# Patient Record
Sex: Female | Born: 1960
Health system: Southern US, Community
[De-identification: ages and names within clinical notes are randomized; demographics above are authoritative.]

## PROBLEM LIST (undated history)

## (undated) DIAGNOSIS — I2510 Atherosclerotic heart disease of native coronary artery without angina pectoris: Secondary | ICD-10-CM

## (undated) DIAGNOSIS — M069 Rheumatoid arthritis, unspecified: Secondary | ICD-10-CM

## (undated) DIAGNOSIS — I10 Essential (primary) hypertension: Secondary | ICD-10-CM

## (undated) DIAGNOSIS — I340 Nonrheumatic mitral (valve) insufficiency: Secondary | ICD-10-CM

## (undated) DIAGNOSIS — Z720 Tobacco use: Secondary | ICD-10-CM

## (undated) DIAGNOSIS — I272 Pulmonary hypertension, unspecified: Secondary | ICD-10-CM

## (undated) DIAGNOSIS — E785 Hyperlipidemia, unspecified: Secondary | ICD-10-CM

## (undated) HISTORY — DX: Nonrheumatic mitral (valve) insufficiency: I34.0

## (undated) HISTORY — DX: Pulmonary hypertension, unspecified: I27.20

## (undated) HISTORY — DX: Hyperlipidemia, unspecified: E78.5

## (undated) HISTORY — DX: Atherosclerotic heart disease of native coronary artery without angina pectoris: I25.10

---

## 1997-08-12 ENCOUNTER — Ambulatory Visit (HOSPITAL_COMMUNITY): Admission: RE | Admit: 1997-08-12 | Discharge: 1997-08-12 | Payer: Self-pay | Admitting: Obstetrics and Gynecology

## 2002-02-07 ENCOUNTER — Other Ambulatory Visit: Admission: RE | Admit: 2002-02-07 | Discharge: 2002-02-07 | Payer: Self-pay | Admitting: Obstetrics and Gynecology

## 2002-04-03 ENCOUNTER — Encounter: Payer: Self-pay | Admitting: Obstetrics and Gynecology

## 2002-04-11 ENCOUNTER — Observation Stay (HOSPITAL_COMMUNITY): Admission: RE | Admit: 2002-04-11 | Discharge: 2002-04-12 | Payer: Self-pay | Admitting: Obstetrics and Gynecology

## 2003-10-20 ENCOUNTER — Ambulatory Visit (HOSPITAL_COMMUNITY): Admission: RE | Admit: 2003-10-20 | Discharge: 2003-10-20 | Payer: Self-pay | Admitting: Orthopedic Surgery

## 2016-07-07 DIAGNOSIS — M0579 Rheumatoid arthritis with rheumatoid factor of multiple sites without organ or systems involvement: Secondary | ICD-10-CM | POA: Diagnosis not present

## 2016-08-17 DIAGNOSIS — M2550 Pain in unspecified joint: Secondary | ICD-10-CM | POA: Diagnosis not present

## 2016-08-17 DIAGNOSIS — Z79899 Other long term (current) drug therapy: Secondary | ICD-10-CM | POA: Diagnosis not present

## 2016-08-17 DIAGNOSIS — M0579 Rheumatoid arthritis with rheumatoid factor of multiple sites without organ or systems involvement: Secondary | ICD-10-CM | POA: Diagnosis not present

## 2016-09-01 DIAGNOSIS — M0579 Rheumatoid arthritis with rheumatoid factor of multiple sites without organ or systems involvement: Secondary | ICD-10-CM | POA: Diagnosis not present

## 2016-10-27 DIAGNOSIS — M0579 Rheumatoid arthritis with rheumatoid factor of multiple sites without organ or systems involvement: Secondary | ICD-10-CM | POA: Diagnosis not present

## 2016-10-27 DIAGNOSIS — Z79899 Other long term (current) drug therapy: Secondary | ICD-10-CM | POA: Diagnosis not present

## 2016-12-14 DIAGNOSIS — M0579 Rheumatoid arthritis with rheumatoid factor of multiple sites without organ or systems involvement: Secondary | ICD-10-CM | POA: Diagnosis not present

## 2016-12-14 DIAGNOSIS — Z79899 Other long term (current) drug therapy: Secondary | ICD-10-CM | POA: Diagnosis not present

## 2016-12-14 DIAGNOSIS — M2550 Pain in unspecified joint: Secondary | ICD-10-CM | POA: Diagnosis not present

## 2016-12-22 DIAGNOSIS — M0579 Rheumatoid arthritis with rheumatoid factor of multiple sites without organ or systems involvement: Secondary | ICD-10-CM | POA: Diagnosis not present

## 2017-01-20 DIAGNOSIS — K047 Periapical abscess without sinus: Secondary | ICD-10-CM | POA: Diagnosis not present

## 2017-01-20 DIAGNOSIS — R197 Diarrhea, unspecified: Secondary | ICD-10-CM | POA: Diagnosis not present

## 2017-01-26 DIAGNOSIS — R197 Diarrhea, unspecified: Secondary | ICD-10-CM | POA: Diagnosis not present

## 2017-02-01 DIAGNOSIS — R197 Diarrhea, unspecified: Secondary | ICD-10-CM | POA: Diagnosis not present

## 2017-02-09 DIAGNOSIS — K529 Noninfective gastroenteritis and colitis, unspecified: Secondary | ICD-10-CM | POA: Diagnosis not present

## 2017-02-09 DIAGNOSIS — K5289 Other specified noninfective gastroenteritis and colitis: Secondary | ICD-10-CM | POA: Diagnosis not present

## 2017-02-10 DIAGNOSIS — J329 Chronic sinusitis, unspecified: Secondary | ICD-10-CM | POA: Diagnosis not present

## 2017-02-10 DIAGNOSIS — I10 Essential (primary) hypertension: Secondary | ICD-10-CM | POA: Diagnosis not present

## 2017-02-17 DIAGNOSIS — I10 Essential (primary) hypertension: Secondary | ICD-10-CM | POA: Diagnosis not present

## 2017-03-16 DIAGNOSIS — M0579 Rheumatoid arthritis with rheumatoid factor of multiple sites without organ or systems involvement: Secondary | ICD-10-CM | POA: Diagnosis not present

## 2017-03-16 DIAGNOSIS — Z79899 Other long term (current) drug therapy: Secondary | ICD-10-CM | POA: Diagnosis not present

## 2017-03-16 DIAGNOSIS — M2550 Pain in unspecified joint: Secondary | ICD-10-CM | POA: Diagnosis not present

## 2017-04-03 DIAGNOSIS — Z720 Tobacco use: Secondary | ICD-10-CM | POA: Diagnosis not present

## 2017-04-03 DIAGNOSIS — I10 Essential (primary) hypertension: Secondary | ICD-10-CM | POA: Diagnosis not present

## 2017-07-24 DIAGNOSIS — Z0000 Encounter for general adult medical examination without abnormal findings: Secondary | ICD-10-CM | POA: Diagnosis not present

## 2017-08-03 DIAGNOSIS — Z79899 Other long term (current) drug therapy: Secondary | ICD-10-CM | POA: Diagnosis not present

## 2017-08-03 DIAGNOSIS — M2550 Pain in unspecified joint: Secondary | ICD-10-CM | POA: Diagnosis not present

## 2017-08-03 DIAGNOSIS — M0579 Rheumatoid arthritis with rheumatoid factor of multiple sites without organ or systems involvement: Secondary | ICD-10-CM | POA: Diagnosis not present

## 2018-02-01 DIAGNOSIS — M2550 Pain in unspecified joint: Secondary | ICD-10-CM | POA: Diagnosis not present

## 2018-02-01 DIAGNOSIS — M0579 Rheumatoid arthritis with rheumatoid factor of multiple sites without organ or systems involvement: Secondary | ICD-10-CM | POA: Diagnosis not present

## 2018-02-01 DIAGNOSIS — Z79899 Other long term (current) drug therapy: Secondary | ICD-10-CM | POA: Diagnosis not present

## 2018-08-06 DIAGNOSIS — M0579 Rheumatoid arthritis with rheumatoid factor of multiple sites without organ or systems involvement: Secondary | ICD-10-CM | POA: Diagnosis not present

## 2018-08-06 DIAGNOSIS — M2550 Pain in unspecified joint: Secondary | ICD-10-CM | POA: Diagnosis not present

## 2018-08-06 DIAGNOSIS — Z79899 Other long term (current) drug therapy: Secondary | ICD-10-CM | POA: Diagnosis not present

## 2019-12-05 ENCOUNTER — Ambulatory Visit: Payer: Self-pay

## 2020-10-12 ENCOUNTER — Other Ambulatory Visit: Payer: Self-pay

## 2020-10-12 ENCOUNTER — Inpatient Hospital Stay (HOSPITAL_COMMUNITY)
Admission: EM | Admit: 2020-10-12 | Discharge: 2020-10-20 | DRG: 234 | Disposition: A | Discharge status: Home / Self Care | Payer: 59 | Attending: Thoracic Surgery (Cardiothoracic Vascular Surgery) | Admitting: Thoracic Surgery (Cardiothoracic Vascular Surgery)

## 2020-10-12 ENCOUNTER — Emergency Department (HOSPITAL_COMMUNITY): Payer: 59

## 2020-10-12 ENCOUNTER — Encounter (HOSPITAL_COMMUNITY): Payer: Self-pay | Admitting: Physician Assistant

## 2020-10-12 DIAGNOSIS — F17210 Nicotine dependence, cigarettes, uncomplicated: Secondary | ICD-10-CM | POA: Diagnosis not present

## 2020-10-12 DIAGNOSIS — I340 Nonrheumatic mitral (valve) insufficiency: Secondary | ICD-10-CM | POA: Diagnosis present

## 2020-10-12 DIAGNOSIS — Z419 Encounter for procedure for purposes other than remedying health state, unspecified: Secondary | ICD-10-CM

## 2020-10-12 DIAGNOSIS — Z720 Tobacco use: Secondary | ICD-10-CM

## 2020-10-12 DIAGNOSIS — Z20822 Contact with and (suspected) exposure to covid-19: Secondary | ICD-10-CM | POA: Diagnosis not present

## 2020-10-12 DIAGNOSIS — Z8249 Family history of ischemic heart disease and other diseases of the circulatory system: Secondary | ICD-10-CM

## 2020-10-12 DIAGNOSIS — I214 Non-ST elevation (NSTEMI) myocardial infarction: Principal | ICD-10-CM | POA: Diagnosis present

## 2020-10-12 DIAGNOSIS — M069 Rheumatoid arthritis, unspecified: Secondary | ICD-10-CM | POA: Diagnosis present

## 2020-10-12 DIAGNOSIS — I160 Hypertensive urgency: Secondary | ICD-10-CM | POA: Diagnosis present

## 2020-10-12 DIAGNOSIS — J9811 Atelectasis: Secondary | ICD-10-CM

## 2020-10-12 DIAGNOSIS — Z9889 Other specified postprocedural states: Secondary | ICD-10-CM

## 2020-10-12 DIAGNOSIS — I25110 Atherosclerotic heart disease of native coronary artery with unstable angina pectoris: Secondary | ICD-10-CM | POA: Diagnosis present

## 2020-10-12 DIAGNOSIS — Z823 Family history of stroke: Secondary | ICD-10-CM

## 2020-10-12 DIAGNOSIS — D62 Acute posthemorrhagic anemia: Secondary | ICD-10-CM | POA: Diagnosis not present

## 2020-10-12 DIAGNOSIS — I2510 Atherosclerotic heart disease of native coronary artery without angina pectoris: Secondary | ICD-10-CM | POA: Diagnosis not present

## 2020-10-12 DIAGNOSIS — Z951 Presence of aortocoronary bypass graft: Secondary | ICD-10-CM

## 2020-10-12 DIAGNOSIS — Z803 Family history of malignant neoplasm of breast: Secondary | ICD-10-CM

## 2020-10-12 DIAGNOSIS — R7303 Prediabetes: Secondary | ICD-10-CM | POA: Diagnosis not present

## 2020-10-12 DIAGNOSIS — Z01810 Encounter for preprocedural cardiovascular examination: Secondary | ICD-10-CM | POA: Diagnosis not present

## 2020-10-12 DIAGNOSIS — D696 Thrombocytopenia, unspecified: Secondary | ICD-10-CM | POA: Diagnosis present

## 2020-10-12 DIAGNOSIS — I10 Essential (primary) hypertension: Secondary | ICD-10-CM | POA: Diagnosis present

## 2020-10-12 DIAGNOSIS — E785 Hyperlipidemia, unspecified: Secondary | ICD-10-CM | POA: Diagnosis not present

## 2020-10-12 HISTORY — DX: Rheumatoid arthritis, unspecified: M06.9

## 2020-10-12 HISTORY — DX: Tobacco use: Z72.0

## 2020-10-12 HISTORY — DX: Essential (primary) hypertension: I10

## 2020-10-12 LAB — CBC
HCT: 45.6 % (ref 36.0–46.0)
Hemoglobin: 14.5 g/dL (ref 12.0–15.0)
MCH: 24.5 pg — ABNORMAL LOW (ref 26.0–34.0)
MCHC: 31.8 g/dL (ref 30.0–36.0)
MCV: 76.9 fL — ABNORMAL LOW (ref 80.0–100.0)
Platelets: 120 10*3/uL — ABNORMAL LOW (ref 150–400)
RBC: 5.93 MIL/uL — ABNORMAL HIGH (ref 3.87–5.11)
RDW: 14.9 % (ref 11.5–15.5)
WBC: 6.2 10*3/uL (ref 4.0–10.5)
nRBC: 0 % (ref 0.0–0.2)

## 2020-10-12 LAB — HEMOGLOBIN A1C
Hgb A1c MFr Bld: 5.9 % — ABNORMAL HIGH (ref 4.8–5.6)
Mean Plasma Glucose: 122.63 mg/dL

## 2020-10-12 LAB — BASIC METABOLIC PANEL
Anion gap: 8 (ref 5–15)
BUN: 9 mg/dL (ref 6–20)
CO2: 24 mmol/L (ref 22–32)
Calcium: 9.4 mg/dL (ref 8.9–10.3)
Chloride: 105 mmol/L (ref 98–111)
Creatinine, Ser: 0.87 mg/dL (ref 0.44–1.00)
GFR, Estimated: >60 mL/min (ref >60)
Glucose, Bld: 110 mg/dL — ABNORMAL HIGH (ref 70–99)
Potassium: 3.9 mmol/L (ref 3.5–5.1)
Sodium: 137 mmol/L (ref 135–145)

## 2020-10-12 LAB — RESP PANEL BY RT-PCR (FLU A&B, COVID) ARPGX2
Influenza A by PCR: NEGATIVE
Influenza B by PCR: NEGATIVE
SARS Coronavirus 2 by RT PCR: NEGATIVE

## 2020-10-12 LAB — TROPONIN I (HIGH SENSITIVITY)
Troponin I (High Sensitivity): 1577 ng/L (ref <18)
Troponin I (High Sensitivity): 3345 ng/L (ref <18)
Troponin I (High Sensitivity): 7755 ng/L (ref <18)
Troponin I (High Sensitivity): 9893 ng/L (ref <18)

## 2020-10-12 LAB — HIV ANTIBODY (ROUTINE TESTING W REFLEX): HIV Screen 4th Generation wRfx: NONREACTIVE

## 2020-10-12 MED ORDER — ONDANSETRON HCL 4 MG/2ML IJ SOLN: 4.0000 mg | Freq: Four times a day (QID) | INTRAMUSCULAR | Status: DC | PRN

## 2020-10-12 MED ORDER — ISOSORBIDE MONONITRATE ER 30 MG PO TB24
30.0000 mg | ORAL_TABLET | Freq: Every day | ORAL | Status: DC
  Administered 2020-10-12 – 2020-10-15 (×4): 30 mg via ORAL
  Filled 2020-10-12 (×4): qty 1

## 2020-10-12 MED ORDER — SODIUM CHLORIDE 0.9 % IV SOLN: 250.0000 mL | INTRAVENOUS | Status: DC | PRN

## 2020-10-12 MED ORDER — CARVEDILOL 6.25 MG PO TABS
6.2500 mg | ORAL_TABLET | Freq: Two times a day (BID) | ORAL | Status: DC
  Administered 2020-10-12 – 2020-10-13 (×2): 6.25 mg via ORAL
  Filled 2020-10-12 (×2): qty 1

## 2020-10-12 MED ORDER — HEPARIN (PORCINE) 25000 UT/250ML-% IV SOLN
1050.0000 [IU]/h | INTRAVENOUS | Status: DC
  Administered 2020-10-12: 800 [IU]/h via INTRAVENOUS
  Filled 2020-10-12: qty 250

## 2020-10-12 MED ORDER — SODIUM CHLORIDE 0.9 % WEIGHT BASED INFUSION: 1.0000 mL/kg/h | INTRAVENOUS | Status: DC

## 2020-10-12 MED ORDER — ASPIRIN 81 MG PO CHEW
81.0000 mg | CHEWABLE_TABLET | ORAL | Status: AC
  Administered 2020-10-13: 81 mg via ORAL
  Filled 2020-10-12: qty 1

## 2020-10-12 MED ORDER — HEPARIN BOLUS VIA INFUSION
4000.0000 [IU] | Freq: Once | INTRAVENOUS | Status: AC
  Administered 2020-10-12: 4000 [IU] via INTRAVENOUS
  Filled 2020-10-12: qty 4000

## 2020-10-12 MED ORDER — HYDRALAZINE HCL 20 MG/ML IJ SOLN
10.0000 mg | Freq: Once | INTRAMUSCULAR | Status: AC
  Administered 2020-10-12: 10 mg via INTRAVENOUS
  Filled 2020-10-12: qty 1

## 2020-10-12 MED ORDER — SODIUM CHLORIDE 0.9 % WEIGHT BASED INFUSION
3.0000 mL/kg/h | INTRAVENOUS | Status: AC
  Administered 2020-10-13: 3 mL/kg/h via INTRAVENOUS

## 2020-10-12 MED ORDER — NITROGLYCERIN 0.4 MG SL SUBL: 0.4000 mg | SUBLINGUAL_TABLET | SUBLINGUAL | Status: DC | PRN

## 2020-10-12 MED ORDER — ACETAMINOPHEN 325 MG PO TABS: 650.0000 mg | ORAL_TABLET | ORAL | Status: DC | PRN

## 2020-10-12 MED ORDER — SODIUM CHLORIDE 0.9% FLUSH: 3.0000 mL | INTRAVENOUS | Status: DC | PRN

## 2020-10-12 MED ORDER — SODIUM CHLORIDE 0.9% FLUSH
3.0000 mL | Freq: Two times a day (BID) | INTRAVENOUS | Status: DC
  Administered 2020-10-13 (×2): 3 mL via INTRAVENOUS

## 2020-10-12 MED ORDER — ATORVASTATIN CALCIUM 80 MG PO TABS
80.0000 mg | ORAL_TABLET | Freq: Every day | ORAL | Status: DC
  Administered 2020-10-12 – 2020-10-20 (×8): 80 mg via ORAL
  Filled 2020-10-12 (×7): qty 1
  Filled 2020-10-12: qty 2

## 2020-10-12 MED ORDER — ASPIRIN EC 81 MG PO TBEC
81.0000 mg | DELAYED_RELEASE_TABLET | Freq: Every day | ORAL | Status: DC
  Administered 2020-10-13: 81 mg via ORAL
  Filled 2020-10-12: qty 1

## 2020-10-12 NOTE — H&P (Addendum)
Cardiology Admission History and Physical:   Patient ID: April Bryant MRN: [id number]; DOB: 1961/03/16   Admission date: 10/12/2020  PCP:  Farris Has, MD   [facility]  Cardiologist: New  Chief Complaint: Throat tightness  Patient Profile:   April Bryant is a 60 y.o. female with history of hypertension, rheumatoid arthritis and tobacco smoking presented for evaluation of throat tightness and found to have elevated troponin consistent with non-STEMI.  History of Present Illness:   Ms. Briski had a sudden onset of throat closing around noon today.  It progressed and felt like she cannot breathe with pressure sensation across chest.  She took inhaler with minimal improved symptoms.  EMS was called.  By the time EMS arrived, her symptoms were resolved.  Total duration of symptoms about 20 minutes.  She did not wanted to go to ER.  However significant other concern after EMS left and brought to [facility] for further evaluation.  Patient had similar but worse episode few weeks ago while walking uphill.  At that time her symptoms lasted for approximately 90 minutes.  Since that episode patient may had a few episodes of throat tightness but only lasting for few seconds.  Patient reports on and off less than half a pack a day tobacco smoking for past 30 years.  Report quits last month.  No illicit drug use.  Denies orthopnea, PND, dizziness, palpitation, abdominal pain, melena or blood in the stool or urine.  High-sensitivity troponin 1577>>3345 Creatinine and electrolyte are normal Hemoglobin 14.5 Pending respiratory panel  Currently asymptomatic.   Past Medical History:  Diagnosis Date   Hypertension    Rheumatoid arthritis (HCC)    Tobacco abuse      Medications Prior to Admission: Prior to Admission medications   Not on File    Reports taking lisinopril  Allergies:   Not on File  Denies allergies to any medications  Social  History:   Social History   Socioeconomic History   Marital status: Single    Spouse name: Not on file   Number of children: Not on file   Years of education: Not on file   Highest education level: Not on file  Occupational History   Not on file  Tobacco Use   Smoking status: Not on file   Smokeless tobacco: Not on file  Substance and Sexual Activity   Alcohol use: Not on file   Drug use: Not on file   Sexual activity: Not on file  Other Topics Concern   Not on file  Social History Narrative   Not on file   Social Determinants of Health   Financial Resource Strain: Not on file  Food Insecurity: Not on file  Transportation Needs: Not on file  Physical Activity: Not on file  Stress: Not on file  Social Connections: Not on file  Intimate Partner Violence: Not on file    Family History:  The patient's family history includes Breast cancer in her mother; Heart failure in her mother.    ROS:  Please see the history of present illness.  All other ROS reviewed and negative.     Physical Exam/Data:   Vitals:   10/12/20 1815 10/12/20 1830 10/12/20 1845 10/12/20 1900  BP: (!) 172/98 (!) 187/90 (!) 184/86 (!) 170/115  Pulse: 67 76 80 (!) 59  Resp: 15 17 15 15   Temp:      TempSrc:      SpO2: 99% 100% 100% 100%  Weight:      Height:       No intake or output data in the 24 hours ending 10/12/20 1920 Last 3 Weights 10/12/2020  Weight (lbs) 147 lb  Weight (kg) 66.679 kg     Body mass index is 26.04 kg/m.  General:  Well nourished, well developed, in no acute distress HEENT: normal Lymph: no adenopathy Neck: no JVD Endocrine:  No thryomegaly Vascular: No carotid bruits; FA pulses 2+ bilaterally without bruits  Cardiac:  normal S1, S2; RRR; no murmur  Lungs:  clear to auscultation bilaterally, no wheezing, rhonchi or rales  Abd: soft, nontender, no hepatomegaly  Ext: not edema Musculoskeletal:  No deformities, BUE and BLE strength normal and equal Skin: warm and  dry  Neuro:  CNs 2-12 intact, no focal abnormalities noted Psych:  Normal affect    EKG:  The ECG that was done today was personally reviewed and demonstrates sinus rhythm with minimal ST depression in lateral leads  Relevant CV Studies: As above  Laboratory Data:  High Sensitivity Troponin:   Recent Labs  Lab 10/12/20 1631 10/12/20 1737  TROPONINIHS 1,577* 3,345*      Chemistry Recent Labs  Lab 10/12/20 1631  NA 137  K 3.9  CL 105  CO2 24  GLUCOSE 110*  BUN 9  CREATININE 0.87  CALCIUM 9.4  GFRNONAA >60  ANIONGAP 8    No results for input(s): PROT, ALBUMIN, AST, ALT, ALKPHOS, BILITOT in the last 168 hours. Hematology Recent Labs  Lab 10/12/20 1631  WBC 6.2  RBC 5.93*  HGB 14.5  HCT 45.6  MCV 76.9*  MCH 24.5*  MCHC 31.8  RDW 14.9  PLT 120*   BNPNo results for input(s): BNP, PROBNP in the last 168 hours.  DDimer No results for input(s): DDIMER in the last 168 hours.   Radiology/Studies:  DG Chest 2 View  Result Date: 10/12/2020 CLINICAL DATA:  Chest pain EXAM: CHEST - 2 VIEW COMPARISON:  March 19, 2009 FINDINGS: The lungs are clear. The heart size and pulmonary vascularity are normal. No adenopathy. There is lower thoracic dextroscoliosis. IMPRESSION: Lungs clear.  Heart size normal. Electronically Signed   By: Bretta Bang III M.D.   On: 10/12/2020 15:17     Assessment and Plan:   Non-STEMI -Patient presented at throat tightness/closing that radiated across chest and felt like chest pressure.  Her symptoms lasted for 15-20 minutes today.  However had a worse episode few weeks ago which lasted for about 90 minutes. -High-sensitivity troponin 1577>> 3345 -She already got aspirin 324 mg when EMS came to her home -Continue IV heparin -Start statin -Start Coreg -Check lipid panel and hemoglobin A1c -Admit and cycle troponin -Get echocardiogram The patient understands that risks include but are not limited to stroke (1 in 1000), death (1 in  1000), kidney failure [usually temporary] (1 in 500), bleeding (1 in 200), allergic reaction [possibly serious] (1 in 200), and agrees to proceed.    2.  Hypertensive urgency -Blood pressure elevated -Reports taking lisinopril at home (Pnding pharmacy review of medications) -Start carvedilol -Give 1 dose of IV hydralazine   Risk Assessment/Risk Scores:   TIMI Risk Score for Unstable Angina or Non-ST Elevation MI:   The patient's TIMI risk score is 3, which indicates a 13% risk of all cause mortality, new or recurrent myocardial infarction or need for urgent revascularization in the next 14 days.{   Severity of Illness: The appropriate patient status for this patient is INPATIENT. Inpatient  status is judged to be reasonable and necessary in order to provide the required intensity of service to ensure the patient's safety. The patient's presenting symptoms, physical exam findings, and initial radiographic and laboratory data in the context of their chronic comorbidities is felt to place them at high risk for further clinical deterioration. Furthermore, it is not anticipated that the patient will be medically stable for discharge from the hospital within 2 midnights of admission. The following factors support the patient status of inpatient.   " The patient's presenting symptoms include throat tightness. " The worrisome physical exam findings include none " The initial radiographic and laboratory data are worrisome because of *elevated enzymes " The chronic co-morbidities include hypertension and tobacco smoking   * I certify that at the point of admission it is my clinical judgment that the patient will require inpatient hospital care spanning beyond 2 midnights from the point of admission due to high intensity of service, high risk for further deterioration and high frequency of surveillance required.*    For questions or updates, please contact [facility] Please consult [email or web address]  for contact info under     Lorelei Pont, PA  10/12/2020 7:20 PM   Personally seen and examined. Agree with APP above with the following comments: Briefly 60 yo F with a history of HTN, Tobacco Abuse, RA, and FHX of CAD (younger brother) Patient notes two episodes of exercise induced throat discomfort; this one has improved with her inhaler.  No CP in ED and patient was considering leaving AMA Exam notable for +3 radial and femoral; BP pre-consent for LHC SBP 169/110; post consent 197/120 Labs notable for Novel troponin 1.5 K -> 3.3 K Personally reviewed relevant tests; no NSVT on telemetry Would recommend  - starting Coreg, and IMDUR, getting Echo - LHC for tomorrow - continue on BP control - patient is amenable to stay and to stop smoking - discussed with patient and wife; who know that depending on clinical scenario, we may not wait until wife's return to proceed to [facility].  Riley Lam, MD Cardiologist [facility]  [address], [address] [place], [place] [address] [phone]  7:49 PM

## 2020-10-12 NOTE — Progress Notes (Signed)
ANTICOAGULATION CONSULT NOTE - Initial Consult  Pharmacy Consult for heparin Indication: chest pain/ACS  Not on File  Patient Measurements: Height: 5\' 3"  (160 cm) Weight: 66.7 kg (147 lb) IBW/kg (Calculated) : 52.4 Heparin Dosing Weight: 65.9 kg  Vital Signs: Temp: 98.4 F (36.9 C) (04/11 1602) Temp Source: Oral (04/11 1602) BP: 162/85 (04/11 1715) Pulse Rate: 57 (04/11 1715)  Labs: Recent Labs    10/12/20 1631  HGB 14.5  HCT 45.6  PLT 120*  CREATININE 0.87  TROPONINIHS 1,577*    Estimated Creatinine Clearance: 63.1 mL/min (by C-G formula based on SCr of 0.87 mg/dL).   Medical History: No past medical history on file.  Medications:  Scheduled:  . heparin  4,000 Units Intravenous Once    Assessment: 60 yof presenting with felt like throat was closing (couldn't take deep breath, had chest pain) - no AC PTA.  Hgb 14.5, plt 120. Trop 1577. No s/sx of bleeding.   Goal of Therapy:  Heparin level 0.3-0.7 units/ml Monitor platelets by anticoagulation protocol: Yes   Plan:  Give 4000 units bolus x 1 Start heparin infusion at 800 units/hr Check anti-Xa level in 6 hours and daily while on heparin Continue to monitor H&H and platelets  12/12/20, PharmD, BCCCP Clinical Pharmacist  Phone: [phone] 10/12/2020 6:30 PM  Please check AMION for all [facility] phone numbers After 10:00 PM, call Main Pharmacy [phone]

## 2020-10-12 NOTE — ED Provider Notes (Signed)
[facility] EMERGENCY DEPARTMENT Provider Note   CSN: [id number] Arrival date & time: 10/12/20  1427     History Chief Complaint  Patient presents with  . Chest Pain    April Bryant is a 60 y.o. female.  60 year old female presents after feeling like her throat was closing up today.  States that she is had similar episodes with cold weather.  Feels that this might be pollen related.  Felt like she could not take a deep breath and her chest became tight.  Also began to hyperventilate had bilateral hand tingling.  Was outside when this occurred and was already diaphoretic.  Patient used her albuterol inhaler which did give her some relief.  EMS was called and she was found to be profoundly hypertensive.  She was transported here for further management.  She currently feels asymptomatic        No past medical history on file.  There are no problems to display for this patient.      OB History   No obstetric history on file.     No family history on file.     Home Medications Prior to Admission medications   Not on File    Allergies    Patient has no allergy information on record.  Review of Systems   Review of Systems  All other systems reviewed and are negative.   Physical Exam Updated Vital Signs BP (!) 208/104   Pulse 82   Temp (!) 97.5 F (36.4 C) (Oral)   SpO2 100%   Physical Exam Vitals and nursing note reviewed.  Constitutional:      General: She is not in acute distress.    Appearance: Normal appearance. She is well-developed. She is not toxic-appearing.  HENT:     Head: Normocephalic and atraumatic.  Eyes:     General: Lids are normal.     Conjunctiva/sclera: Conjunctivae normal.     Pupils: Pupils are equal, round, and reactive to light.  Neck:     Thyroid: No thyroid mass.     Trachea: No tracheal deviation.  Cardiovascular:     Rate and Rhythm: Normal rate and regular rhythm.     Heart sounds: Normal heart  sounds. No murmur heard. No gallop.   Pulmonary:     Effort: Pulmonary effort is normal. No respiratory distress.     Breath sounds: Normal breath sounds. No stridor. No decreased breath sounds, wheezing, rhonchi or rales.  Abdominal:     General: Bowel sounds are normal. There is no distension.     Palpations: Abdomen is soft.     Tenderness: There is no abdominal tenderness. There is no rebound.  Musculoskeletal:        General: No tenderness. Normal range of motion.     Cervical back: Normal range of motion and neck supple.  Skin:    General: Skin is warm and dry.     Findings: No abrasion or rash.  Neurological:     Mental Status: She is alert and oriented to person, place, and time.     GCS: GCS eye subscore is 4. GCS verbal subscore is 5. GCS motor subscore is 6.     Cranial Nerves: No cranial nerve deficit.     Sensory: No sensory deficit.  Psychiatric:        Speech: Speech normal.        Behavior: Behavior normal.     ED Results / Procedures / Treatments  Labs (all labs ordered are listed, but only abnormal results are displayed) Labs Reviewed  BASIC METABOLIC PANEL  CBC  TROPONIN I (HIGH SENSITIVITY)    EKG EKG Interpretation  Date/Time:  Monday October 12 2020 14:39:07 EDT Ventricular Rate:  68 PR Interval:  154 QRS Duration: 86 QT Interval:  440 QTC Calculation: 467 R Axis:   59 Text Interpretation: Normal sinus rhythm Possible Left atrial enlargement ST-t wave abnormality Abnormal ECG Confirmed by Gerhard Munch [phone]) on 10/12/2020 2:45:35 PM   Radiology DG Chest 2 View  Result Date: 10/12/2020 CLINICAL DATA:  Chest pain EXAM: CHEST - 2 VIEW COMPARISON:  March 19, 2009 FINDINGS: The lungs are clear. The heart size and pulmonary vascularity are normal. No adenopathy. There is lower thoracic dextroscoliosis. IMPRESSION: Lungs clear.  Heart size normal. Electronically Signed   By: Bretta Bang III M.D.   On: 10/12/2020 15:17     Procedures Procedures   Medications Ordered in ED Medications - No data to display  ED Course  I have reviewed the triage vital signs and the nursing notes.  Pertinent labs & imaging results that were available during my care of the patient were reviewed by me and considered in my medical decision making (see chart for details).    MDM Rules/Calculators/A&P                         Patient's EKG does show some new inferolateral ST depression.  Troponin came back elevated at almost 1600.  She is currently pain-free at this time.  Have ordered heparin per pharmacy.  Discussed with Dr. Gaynelle Arabian from cardiology and he will come admit the patient  CRITICAL CARE Performed by: Toy Baker Total critical care time: 50 minutes Critical care time was exclusive of separately billable procedures and treating other patients. Critical care was necessary to treat or prevent imminent or life-threatening deterioration. Critical care was time spent personally by me on the following activities: development of treatment plan with patient and/or surrogate as well as nursing, discussions with consultants, evaluation of patient's response to treatment, examination of patient, obtaining history from patient or surrogate, ordering and performing treatments and interventions, ordering and review of laboratory studies, ordering and review of radiographic studies, pulse oximetry and re-evaluation of patient's condition.  Final Clinical Impression(s) / ED Diagnoses Final diagnoses:  None    Rx / DC Orders ED Discharge Orders    None       Lorre Nick, MD 10/12/20 1806

## 2020-10-12 NOTE — ED Notes (Signed)
This RN went to start Heparin and explained the purpose of the medication and pt voiced that she did not want to be admitted and that she wanted to leave. Cardiologist is now at bedside.

## 2020-10-12 NOTE — ED Notes (Signed)
Attempted to give reportx1 

## 2020-10-12 NOTE — ED Triage Notes (Signed)
Patient stated her throat felt like closing up earlier today followed by shortness of breath and chest pain. Endorsed taking 4 baby ASA PTA.

## 2020-10-13 ENCOUNTER — Inpatient Hospital Stay (HOSPITAL_COMMUNITY): Payer: 59

## 2020-10-13 ENCOUNTER — Telehealth: Payer: Self-pay | Admitting: Internal Medicine

## 2020-10-13 ENCOUNTER — Encounter (HOSPITAL_COMMUNITY)
Admission: EM | Disposition: A | Discharge status: Home / Self Care | Payer: Self-pay | Source: Home / Self Care | Attending: Thoracic Surgery (Cardiothoracic Vascular Surgery)

## 2020-10-13 ENCOUNTER — Encounter (HOSPITAL_COMMUNITY): Payer: Self-pay | Admitting: Internal Medicine

## 2020-10-13 DIAGNOSIS — I10 Essential (primary) hypertension: Secondary | ICD-10-CM | POA: Diagnosis present

## 2020-10-13 DIAGNOSIS — I214 Non-ST elevation (NSTEMI) myocardial infarction: Secondary | ICD-10-CM

## 2020-10-13 DIAGNOSIS — R7303 Prediabetes: Secondary | ICD-10-CM | POA: Diagnosis present

## 2020-10-13 DIAGNOSIS — I340 Nonrheumatic mitral (valve) insufficiency: Secondary | ICD-10-CM

## 2020-10-13 DIAGNOSIS — I2510 Atherosclerotic heart disease of native coronary artery without angina pectoris: Secondary | ICD-10-CM

## 2020-10-13 DIAGNOSIS — I25110 Atherosclerotic heart disease of native coronary artery with unstable angina pectoris: Secondary | ICD-10-CM

## 2020-10-13 DIAGNOSIS — E785 Hyperlipidemia, unspecified: Secondary | ICD-10-CM | POA: Diagnosis present

## 2020-10-13 DIAGNOSIS — I160 Hypertensive urgency: Secondary | ICD-10-CM | POA: Diagnosis not present

## 2020-10-13 DIAGNOSIS — Z720 Tobacco use: Secondary | ICD-10-CM | POA: Diagnosis not present

## 2020-10-13 HISTORY — PX: INTRAVASCULAR PRESSURE WIRE/FFR STUDY: CATH118243

## 2020-10-13 HISTORY — PX: LEFT HEART CATH AND CORONARY ANGIOGRAPHY: CATH118249

## 2020-10-13 LAB — ECHOCARDIOGRAM COMPLETE
AR max vel: 1.58 cm2
AV Area VTI: 1.42 cm2
AV Area mean vel: 1.58 cm2
AV Mean grad: 5 mmHg
AV Peak grad: 8.9 mmHg
Ao pk vel: 1.49 m/s
Area-P 1/2: 3.5 cm2
Calc EF: 36.5 %
Height: 63 in
MV M vel: 5.58 m/s
MV Peak grad: 124.3 mmHg
P 1/2 time: 730 msec
Radius: 0.35 cm
S' Lateral: 2.8 cm
Single Plane A2C EF: 40.3 %
Single Plane A4C EF: 35.7 %
Weight: 2304 oz

## 2020-10-13 LAB — BASIC METABOLIC PANEL
Anion gap: 9 (ref 5–15)
BUN: 8 mg/dL (ref 6–20)
CO2: 22 mmol/L (ref 22–32)
Calcium: 9 mg/dL (ref 8.9–10.3)
Chloride: 108 mmol/L (ref 98–111)
Creatinine, Ser: 0.66 mg/dL (ref 0.44–1.00)
GFR, Estimated: >60 mL/min (ref >60)
Glucose, Bld: 108 mg/dL — ABNORMAL HIGH (ref 70–99)
Potassium: 3.5 mmol/L (ref 3.5–5.1)
Sodium: 139 mmol/L (ref 135–145)

## 2020-10-13 LAB — URINALYSIS, COMPLETE (UACMP) WITH MICROSCOPIC
Bacteria, UA: NONE SEEN
Bilirubin Urine: NEGATIVE
Glucose, UA: NEGATIVE mg/dL
Hgb urine dipstick: NEGATIVE
Ketones, ur: NEGATIVE mg/dL
Leukocytes,Ua: NEGATIVE
Nitrite: NEGATIVE
Protein, ur: NEGATIVE mg/dL
Specific Gravity, Urine: 1.014 (ref 1.005–1.030)
pH: 6 (ref 5.0–8.0)

## 2020-10-13 LAB — CBC
HCT: 40.7 % (ref 36.0–46.0)
Hemoglobin: 13 g/dL (ref 12.0–15.0)
MCH: 24.3 pg — ABNORMAL LOW (ref 26.0–34.0)
MCHC: 31.9 g/dL (ref 30.0–36.0)
MCV: 76.1 fL — ABNORMAL LOW (ref 80.0–100.0)
Platelets: 130 10*3/uL — ABNORMAL LOW (ref 150–400)
RBC: 5.35 MIL/uL — ABNORMAL HIGH (ref 3.87–5.11)
RDW: 14.9 % (ref 11.5–15.5)
WBC: 5.8 10*3/uL (ref 4.0–10.5)
nRBC: 0 % (ref 0.0–0.2)

## 2020-10-13 LAB — LIPID PANEL
Cholesterol: 216 mg/dL — ABNORMAL HIGH (ref 0–200)
HDL: 66 mg/dL (ref >40)
LDL Cholesterol: 134 mg/dL — ABNORMAL HIGH (ref 0–99)
Total CHOL/HDL Ratio: 3.3 RATIO
Triglycerides: 82 mg/dL (ref <150)
VLDL: 16 mg/dL (ref 0–40)

## 2020-10-13 LAB — HEPARIN LEVEL (UNFRACTIONATED): Heparin Unfractionated: 0.15 IU/mL — ABNORMAL LOW (ref 0.30–0.70)

## 2020-10-13 LAB — POCT ACTIVATED CLOTTING TIME: Activated Clotting Time: 386 seconds

## 2020-10-13 SURGERY — LEFT HEART CATH AND CORONARY ANGIOGRAPHY
Anesthesia: LOCAL

## 2020-10-13 MED ORDER — SODIUM CHLORIDE 0.9 % IV SOLN
250.0000 mL | INTRAVENOUS | Status: DC | PRN
  Administered 2020-10-17: 250 mL via INTRAVENOUS

## 2020-10-13 MED ORDER — FENTANYL CITRATE (PF) 100 MCG/2ML IJ SOLN
INTRAMUSCULAR | Status: DC | PRN
  Administered 2020-10-13: 50 ug via INTRAVENOUS

## 2020-10-13 MED ORDER — SODIUM CHLORIDE 0.9 % IV SOLN: INTRAVENOUS | Status: AC

## 2020-10-13 MED ORDER — SODIUM CHLORIDE 0.9% FLUSH: 3.0000 mL | INTRAVENOUS | Status: DC | PRN

## 2020-10-13 MED ORDER — ONDANSETRON HCL 4 MG/2ML IJ SOLN: 4.0000 mg | Freq: Four times a day (QID) | INTRAMUSCULAR | Status: DC | PRN

## 2020-10-13 MED ORDER — ADENOSINE (DIAGNOSTIC) 140MCG/KG/MIN
INTRAVENOUS | Status: DC | PRN
  Administered 2020-10-13: 140 ug/kg/min via INTRAVENOUS

## 2020-10-13 MED ORDER — IOHEXOL 350 MG/ML SOLN
INTRAVENOUS | Status: DC | PRN
  Administered 2020-10-13: 150 mL

## 2020-10-13 MED ORDER — HEPARIN BOLUS VIA INFUSION
2000.0000 [IU] | Freq: Once | INTRAVENOUS | Status: AC
  Administered 2020-10-13: 2000 [IU] via INTRAVENOUS
  Filled 2020-10-13: qty 2000

## 2020-10-13 MED ORDER — LIDOCAINE HCL (PF) 1 % IJ SOLN
INTRAMUSCULAR | Status: DC | PRN
  Administered 2020-10-13: 2 mL

## 2020-10-13 MED ORDER — CARVEDILOL 12.5 MG PO TABS
12.5000 mg | ORAL_TABLET | Freq: Two times a day (BID) | ORAL | Status: DC
  Administered 2020-10-13 – 2020-10-14 (×2): 12.5 mg via ORAL
  Filled 2020-10-13 (×2): qty 1

## 2020-10-13 MED ORDER — HEPARIN SODIUM (PORCINE) 1000 UNIT/ML IJ SOLN
INTRAMUSCULAR | Status: AC
  Filled 2020-10-13: qty 1

## 2020-10-13 MED ORDER — HEPARIN (PORCINE) IN NACL 1000-0.9 UT/500ML-% IV SOLN
INTRAVENOUS | Status: AC
  Filled 2020-10-13: qty 500

## 2020-10-13 MED ORDER — FENTANYL CITRATE (PF) 100 MCG/2ML IJ SOLN
INTRAMUSCULAR | Status: AC
  Filled 2020-10-13: qty 2

## 2020-10-13 MED ORDER — HYDRALAZINE HCL 20 MG/ML IJ SOLN: 10.0000 mg | INTRAMUSCULAR | Status: AC | PRN

## 2020-10-13 MED ORDER — VERAPAMIL HCL 2.5 MG/ML IV SOLN
INTRAVENOUS | Status: DC | PRN
  Administered 2020-10-13: 10 mL via INTRA_ARTERIAL

## 2020-10-13 MED ORDER — LIDOCAINE HCL (PF) 1 % IJ SOLN
INTRAMUSCULAR | Status: AC
  Filled 2020-10-13: qty 30

## 2020-10-13 MED ORDER — LABETALOL HCL 5 MG/ML IV SOLN: 10.0000 mg | INTRAVENOUS | Status: AC | PRN

## 2020-10-13 MED ORDER — ACETAMINOPHEN 325 MG PO TABS
650.0000 mg | ORAL_TABLET | ORAL | Status: DC | PRN
  Administered 2020-10-15: 650 mg via ORAL
  Filled 2020-10-13: qty 2

## 2020-10-13 MED ORDER — HEPARIN SODIUM (PORCINE) 1000 UNIT/ML IJ SOLN
INTRAMUSCULAR | Status: DC | PRN
  Administered 2020-10-13 (×2): 4000 [IU] via INTRAVENOUS

## 2020-10-13 MED ORDER — MIDAZOLAM HCL 2 MG/2ML IJ SOLN
INTRAMUSCULAR | Status: DC | PRN
  Administered 2020-10-13 (×2): 1 mg via INTRAVENOUS

## 2020-10-13 MED ORDER — NITROGLYCERIN IN D5W 200-5 MCG/ML-% IV SOLN
10.0000 ug/min | INTRAVENOUS | Status: DC
  Administered 2020-10-13: 10 ug/min via INTRAVENOUS
  Filled 2020-10-13: qty 250

## 2020-10-13 MED ORDER — HEPARIN (PORCINE) 25000 UT/250ML-% IV SOLN
1250.0000 [IU]/h | INTRAVENOUS | Status: AC
  Administered 2020-10-13: 1050 [IU]/h via INTRAVENOUS
  Administered 2020-10-14 – 2020-10-15 (×2): 1250 [IU]/h via INTRAVENOUS
  Filled 2020-10-13 (×3): qty 250

## 2020-10-13 MED ORDER — ASPIRIN 81 MG PO CHEW
81.0000 mg | CHEWABLE_TABLET | Freq: Every day | ORAL | Status: DC
  Administered 2020-10-14 – 2020-10-15 (×2): 81 mg via ORAL
  Filled 2020-10-13 (×2): qty 1

## 2020-10-13 MED ORDER — MIDAZOLAM HCL 2 MG/2ML IJ SOLN
INTRAMUSCULAR | Status: AC
  Filled 2020-10-13: qty 2

## 2020-10-13 MED ORDER — SODIUM CHLORIDE 0.9% FLUSH
3.0000 mL | Freq: Two times a day (BID) | INTRAVENOUS | Status: DC
  Administered 2020-10-13 – 2020-10-18 (×6): 3 mL via INTRAVENOUS

## 2020-10-13 MED ORDER — VERAPAMIL HCL 2.5 MG/ML IV SOLN
INTRAVENOUS | Status: AC
  Filled 2020-10-13: qty 2

## 2020-10-13 MED ORDER — HEPARIN (PORCINE) IN NACL 1000-0.9 UT/500ML-% IV SOLN
INTRAVENOUS | Status: DC | PRN
  Administered 2020-10-13 (×2): 500 mL

## 2020-10-13 MED ORDER — LISINOPRIL 5 MG PO TABS
2.5000 mg | ORAL_TABLET | Freq: Every day | ORAL | Status: DC
  Administered 2020-10-13: 2.5 mg via ORAL
  Filled 2020-10-13: qty 1

## 2020-10-13 MED ORDER — ADENOSINE 12 MG/4ML IV SOLN
INTRAVENOUS | Status: AC
  Filled 2020-10-13: qty 16

## 2020-10-13 SURGICAL SUPPLY — 14 items
CATH 5FR JL3.5 JR4 ANG PIG MP (CATHETERS) ×2 IMPLANT
CATH VISTA GUIDE 6FR XBLAD3.5 (CATHETERS) ×2 IMPLANT
DEVICE RAD COMP TR BAND LRG (VASCULAR PRODUCTS) ×2 IMPLANT
GLIDESHEATH SLEND A-KIT 6F 22G (SHEATH) ×2 IMPLANT
GUIDEWIRE INQWIRE 1.5J.035X260 (WIRE) ×1 IMPLANT
GUIDEWIRE PRESSURE X 175 (WIRE) ×2 IMPLANT
INQWIRE 1.5J .035X260CM (WIRE) ×2
KIT ENCORE 26 ADVANTAGE (KITS) ×2 IMPLANT
KIT ESSENTIALS PG (KITS) ×2 IMPLANT
KIT HEART LEFT (KITS) ×2 IMPLANT
PACK CARDIAC CATHETERIZATION (CUSTOM PROCEDURE TRAY) ×2 IMPLANT
SHEATH PROBE COVER 6X72 (BAG) ×2 IMPLANT
TRANSDUCER W/STOPCOCK (MISCELLANEOUS) ×2 IMPLANT
TUBING CIL FLEX 10 FLL-RA (TUBING) ×2 IMPLANT

## 2020-10-13 NOTE — Progress Notes (Signed)
Progress Note  Patient Name: April Bryant Date of Encounter: 10/13/2020  [facility] Cardiologist: Christell Constant, MD   Subjective   No events overnight.  Found to have mutli-vessel disease.  CT Surg aware and will eval patient.  Patient and wife are nervous with many questions about surgery.  Inpatient Medications    Scheduled Meds: . aspirin EC  81 mg Oral Daily  . atorvastatin  80 mg Oral Daily  . carvedilol  12.5 mg Oral BID WC  . isosorbide mononitrate  30 mg Oral Daily  . lisinopril  2.5 mg Oral Daily  . sodium chloride flush  3 mL Intravenous Q12H   Continuous Infusions: . sodium chloride    . sodium chloride 1 mL/kg/hr (10/13/20 0500)  . heparin     PRN Meds: sodium chloride, acetaminophen, nitroGLYCERIN, ondansetron (ZOFRAN) IV, sodium chloride flush   Vital Signs    Vitals:   10/13/20 1243 10/13/20 1300 10/13/20 1313 10/13/20 1328  BP: (!) 168/99 (!) 164/84 (!) 165/91 (!) 165/88  Pulse: 85 75 72 63  Resp:      Temp:      TempSrc:      SpO2: 100% 97% 100% 100%  Weight:      Height:       No intake or output data in the 24 hours ending 10/13/20 1406 Last 3 Weights 10/13/2020 10/12/2020 10/12/2020  Weight (lbs) 144 lb 144 lb 1.6 oz 147 lb  Weight (kg) 65.318 kg 65.363 kg 66.679 kg      Telemetry    SR to Sinus Tach - Personally Reviewed  ECG    Sinus rhythm, rate 82 bpm, LVH with early repol, submm STD in inferolateral leads; no significant change from previous - Personally Reviewed  Physical Exam   GEN: No acute distress.   Neck: No JVD Cardiac: RRR, no murmurs, rubs, or gallops.  No R radial hematoma Respiratory: Clear to auscultation bilaterally. GI: Soft, nontender, non-distended  MS: No edema; No deformity. Neuro:  Nonfocal  Psych: Normal affect   Labs    High Sensitivity Troponin:   Recent Labs  Lab 10/12/20 1631 10/12/20 1737 10/12/20 1928 10/12/20 2212  TROPONINIHS 1,577* 3,345* 7,755* 9,893*       Chemistry Recent Labs  Lab 10/12/20 1631 10/13/20 0145  NA 137 139  K 3.9 3.5  CL 105 108  CO2 24 22  GLUCOSE 110* 108*  BUN 9 8  CREATININE 0.87 0.66  CALCIUM 9.4 9.0  GFRNONAA >60 >60  ANIONGAP 8 9     Hematology Recent Labs  Lab 10/12/20 1631 10/13/20 0145  WBC 6.2 5.8  RBC 5.93* 5.35*  HGB 14.5 13.0  HCT 45.6 40.7  MCV 76.9* 76.1*  MCH 24.5* 24.3*  MCHC 31.8 31.9  RDW 14.9 14.9  PLT 120* 130*    BNPNo results for input(s): BNP, PROBNP in the last 168 hours.   DDimer No results for input(s): DDIMER in the last 168 hours.   Radiology    DG Chest 2 View  Result Date: 10/12/2020 CLINICAL DATA:  Chest pain EXAM: CHEST - 2 VIEW COMPARISON:  March 19, 2009 FINDINGS: The lungs are clear. The heart size and pulmonary vascularity are normal. No adenopathy. There is lower thoracic dextroscoliosis. IMPRESSION: Lungs clear.  Heart size normal. Electronically Signed   By: Bretta Bang III M.D.   On: 10/12/2020 15:17   CARDIAC CATHETERIZATION  Result Date: 10/13/2020  Culprit lesion is a large branching ramus intermedius and  has ostial to proximal narrowing without landing zone for PCI.  Left main is widely patent  LAD proximal to mid segment with intermediate stenosis in the 65 to 75% range documented to be hemodynamically significant by both RFR (0.82) and FFR (0.72).  Circumflex is relatively small.  There is a moderate-sized third obtuse marginal.  The mid vessel contains eccentric 50 to 60% narrowing.  RCA is dominant.  There is significant tortuosity.  The distal RCA before the PDA contains segmental 50% narrowing.  The ostial to proximal PDA is 60-70% narrowed.  Overall LV function is normal with EF 60%. RECOMMENDATIONS:  TCTS consultation.  There is three-vessel coronary artery disease in this patient who smokes, has significant hypertension, is prediabetic, and hyperlipidemia.  PCI on ramus intermedius would require overhang of stent into the distal  left main.  ECHOCARDIOGRAM COMPLETE  Result Date: 10/13/2020    ECHOCARDIOGRAM REPORT   Patient Name:   April Bryant Date of Exam: 10/13/2020 Medical Rec #:  [id number]        Height:       63.0 in Accession #:    [id number]       Weight:       144.0 lb Date of Birth:  [birth year]        BSA:          1.682 m Patient Age:    60 years         BP:           182/94 mmHg Patient Gender: F                HR:           60 bpm. Exam Location:  Inpatient Procedure: 2D Echo, Cardiac Doppler and Color Doppler Indications:    NSTEMI  History:        Patient has no prior history of Echocardiogram examinations.                 Risk Factors:Hypertension.  Sonographer:    Neomia Dear RDCS Referring Phys: [id number] Manson Passey  Sonographer Comments: 10/13/20 cath IMPRESSIONS  1. Left ventricular ejection fraction, by estimation, is 55 to 60%. The left ventricle has normal function. The left ventricle has no regional wall motion abnormalities. Left ventricular diastolic parameters are consistent with Grade II diastolic dysfunction (pseudonormalization).  2. Right ventricular systolic function is normal. The right ventricular size is normal. There is moderately elevated pulmonary artery systolic pressure.  3. Left atrial size was mild to moderately dilated.  4. The mitral valve is normal in structure. Moderate mitral valve regurgitation. No evidence of mitral stenosis.  5. The aortic valve is normal in structure. Aortic valve regurgitation is not visualized. No aortic stenosis is present. FINDINGS  Left Ventricle: Left ventricular ejection fraction, by estimation, is 55 to 60%. The left ventricle has normal function. The left ventricle has no regional wall motion abnormalities. The left ventricular internal cavity size was normal in size. There is  no left ventricular hypertrophy. Left ventricular diastolic parameters are consistent with Grade II diastolic dysfunction (pseudonormalization). Right Ventricle: The right  ventricular size is normal. No increase in right ventricular wall thickness. Right ventricular systolic function is normal. There is moderately elevated pulmonary artery systolic pressure. The tricuspid regurgitant velocity is 3.76 m/s, and with an assumed right atrial pressure of 3 mmHg, the estimated right ventricular systolic pressure is 59.6 mmHg. Left Atrium: Left atrial size was mild to moderately dilated. Right Atrium: Right  atrial size was normal in size. Pericardium: There is no evidence of pericardial effusion. Mitral Valve: The mitral valve is normal in structure. Moderate mitral valve regurgitation. No evidence of mitral valve stenosis. Tricuspid Valve: The tricuspid valve is normal in structure. Tricuspid valve regurgitation is not demonstrated. No evidence of tricuspid stenosis. Aortic Valve: The aortic valve is normal in structure. Aortic valve regurgitation is not visualized. Aortic regurgitation PHT measures 730 msec. No aortic stenosis is present. Aortic valve mean gradient measures 5.0 mmHg. Aortic valve peak gradient measures 8.9 mmHg. Aortic valve area, by VTI measures 1.42 cm. Pulmonic Valve: The pulmonic valve was normal in structure. Pulmonic valve regurgitation is not visualized. Aorta: The aortic root and ascending aorta are structurally normal, with no evidence of dilitation. IAS/Shunts: The atrial septum is grossly normal.  LEFT VENTRICLE PLAX 2D LVIDd:         4.60 cm     Diastology LVIDs:         2.80 cm     LV e' medial:    5.87 cm/s LV PW:         1.10 cm     LV E/e' medial:  15.8 LV IVS:        1.40 cm     LV e' lateral:   4.57 cm/s LVOT diam:     2.00 cm     LV E/e' lateral: 20.3 LV SV:         47 LV SV Index:   28 LVOT Area:     3.14 cm  LV Volumes (MOD) LV vol d, MOD A2C: 68.9 ml LV vol d, MOD A4C: 83.3 ml LV vol s, MOD A2C: 41.1 ml LV vol s, MOD A4C: 53.5 ml LV SV MOD A2C:     27.8 ml LV SV MOD A4C:     83.3 ml LV SV MOD BP:      27.4 ml  PULMONARY VEINS A Reversal Duration:  108.00 msec A Reversal Velocity: 26.20 cm/s Diastolic Velocity:  49.40 cm/s S/D Velocity:        1.20 Systolic Velocity:   60.80 cm/s LEFT ATRIUM             Index       RIGHT ATRIUM          Index LA diam:        4.00 cm 2.38 cm/m  RA Area:     8.92 cm LA Vol (A2C):   49.1 ml 29.20 ml/m RA Volume:   18.50 ml 11.00 ml/m LA Vol (A4C):   92.7 ml 55.12 ml/m LA Biplane Vol: 68.1 ml 40.50 ml/m  AORTIC VALVE                    PULMONIC VALVE AV Area (Vmax):    1.58 cm     PV Vmax:       0.95 m/s AV Area (Vmean):   1.58 cm     PV Vmean:      72.700 cm/s AV Area (VTI):     1.42 cm     PV VTI:        0.222 m AV Vmax:           149.00 cm/s  PV Peak grad:  3.6 mmHg AV Vmean:          103.400 cm/s PV Mean grad:  2.0 mmHg AV VTI:            0.332 m AV Peak Grad:  8.9 mmHg AV Mean Grad:      5.0 mmHg LVOT Vmax:         74.75 cm/s LVOT Vmean:        52.100 cm/s LVOT VTI:          0.150 m LVOT/AV VTI ratio: 0.45 AI PHT:            730 msec  AORTA Ao Root diam: 3.30 cm Ao Asc diam:  2.90 cm MITRAL VALVE                 TRICUSPID VALVE MV Area (PHT): 3.50 cm      TR Peak grad:   56.6 mmHg MV Decel Time: 217 msec      TR Vmax:        376.00 cm/s MR Peak grad:    124.3 mmHg MR Mean grad:    84.5 mmHg   SHUNTS MR Vmax:         557.50 cm/s Systemic VTI:  0.15 m MR Vmean:        441.0 cm/s  Systemic Diam: 2.00 cm MR PISA:         0.77 cm MR PISA Eff ROA: 3 mm MR PISA Radius:  0.35 cm MV E velocity: 92.70 cm/s MV A velocity: 112.00 cm/s MV E/A ratio:  0.83 Kristeen Miss MD Electronically signed by Kristeen Miss MD Signature Date/Time: 10/13/2020/11:52:15 AM    Final     Cardiac Studies   LHC 10/13/20:  Culprit lesion is a large branching ramus intermedius and has ostial to proximal narrowing without landing zone for PCI.  Left main is widely patent  LAD proximal to mid segment with intermediate stenosis in the 65 to 75% range documented to be hemodynamically significant by both RFR (0.82) and FFR  (0.72).  Circumflex is relatively small.  There is a moderate-sized third obtuse marginal.  The mid vessel contains eccentric 50 to 60% narrowing.  RCA is dominant.  There is significant tortuosity.  The distal RCA before the PDA contains segmental 50% narrowing.  The ostial to proximal PDA is 60-70% narrowed.  Overall LV function is normal with EF 60%.  RECOMMENDATIONS:   TCTS consultation.  There is three-vessel coronary artery disease in this patient who smokes, has significant hypertension, is prediabetic, and hyperlipidemia.  PCI on ramus intermedius would require overhang of stent into the distal left main.    Echocardiogram 10/13/20: 1. Left ventricular ejection fraction, by estimation, is 55 to 60%. The  left ventricle has normal function. The left ventricle has no regional  wall motion abnormalities. Left ventricular diastolic parameters are  consistent with Grade II diastolic  dysfunction (pseudonormalization).  2. Right ventricular systolic function is normal. The right ventricular  size is normal. There is moderately elevated pulmonary artery systolic  pressure.  3. Left atrial size was mild to moderately dilated.  4. The mitral valve is normal in structure. Moderate mitral valve  regurgitation. No evidence of mitral stenosis.  5. The aortic valve is normal in structure. Aortic valve regurgitation is  not visualized. No aortic stenosis is present.   Patient Profile     60 y.o. female with a PMH of HTN, RA, and tobacco abuse, who is being followed by cardiology for NSTEMI.  Assessment & Plan    1. NSTEMI in patient without known CAD: patient presented with throat tightness, found to have elevated HsTrop which peaked at 9893. EKG with submm STD in inferolateral leads. She was started on a  heparin gtt given c/f ACS. She underwent LHC 10/13/20 which showed multivessel disease. Echocardiogram with no WMA.  - Continue aspirin - Continue statin - Continue  BBlocker - planned to see CT surgery  2. Hypertensive urgency: BP significantly elevated on admission. Still quite high despite starting carvedilol 6.25mg  BID and imdur 30mg  daily.  - Increasing carvedilol today, will start low dose ACEi and imdur  3. HLD: LDL 134 this admission. Started on atorvastatin 80mg  daily given c/f ACS - Continue atorvastatin - Will need repeat FLP/LFTs in 6-8 weeks  4. Pre-DM type 2: A1C 5.9 this admission - Continue lifestyle/dietary modifications to prevent progression.   5. Tobacco abuse: smoking 1/2 ppd for 30+ years - continue to encourage cessation.   Discussed at length with patient and wife   For questions or updates, please contact [facility] Please consult [email or web address] for contact info under        Signed, , MD  10/13/2020, 2:06 PM

## 2020-10-13 NOTE — Telephone Encounter (Signed)
April Bryant with [facility] [employer] states [facility] received request from Dr. Izora Ribas for a 4-day observational stay at [facility], beginning yesterday. She states [facility] still needs to have clinicals from days 1-4 faxed to them.  Authorization/reference #: [id number] Fax #: [phone].

## 2020-10-13 NOTE — Interval H&P Note (Signed)
Cath Lab Visit (complete for each Cath Lab visit)  Clinical Evaluation Leading to the Procedure:   ACS: Yes.    Non-ACS:    Anginal Classification: CCS III  Anti-ischemic medical therapy: Minimal Therapy (1 class of medications)  Non-Invasive Test Results: No non-invasive testing performed  Prior CABG: No previous CABG      History and Physical Interval Note:  10/13/2020 10:46 AM  April Bryant  has presented today for surgery, with the diagnosis of NSTEMI.  The various methods of treatment have been discussed with the patient and family. After consideration of risks, benefits and other options for treatment, the patient has consented to  Procedure(s): LEFT HEART CATH AND CORONARY ANGIOGRAPHY (N/A) as a surgical intervention.  The patient's history has been reviewed, patient examined, no change in status, stable for surgery.  I have reviewed the patient's chart and labs.  Questions were answered to the patient's satisfaction.     Lyn Records III

## 2020-10-13 NOTE — Consult Note (Addendum)
[address]       [place] [address]             [phone]        [name] [facility] Medical Record #[id number] Date of Birth: 1960/12/28  Referring: Christell Constant, MD Primary Care: Farris Has, MD Primary Cardiologist:Mahesh Richardean Chimera, MD  Chief Complaint:    Chief Complaint  Patient presents with  . 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 She then felt some chest pressure.She took inhaler (thinks Albuterol as thought maybe allergies) with minimal improved symptoms. She denied shortness of breath or nausea. it was hot outside so unsure of diaphoresis since she was already sweating from working outside.  EMS was called.  By the time EMS arrived, her symptoms were resolved. EKG showed normal sinus rhythm, possible left atrial enlargement, ST-t wave abnormality . Initial Troponin (high sensitivity) was 1577 and has a max of 9893. Cardiac catheterization done today showed LVEF 55-65%, LAD proximal to mid segment with intermediate stenosis in the 65 to 75%, Circumflex vessel is small and there is a 60% stenosis, 95% stenosis of Ramus Intermediate, distal RCA is 50% stenosed, and RPDA has a 75% stenosis. Echo showed LVEF 55-60% , moderate MV regurgitation. At the time of exam, patient denies shortness of breath or chest pain.  Current Activity/ Functional Status: Patient is independent with mobility/ambulation, transfers, ADL's, IADL's.   Zubrod Score: At the time of surgery this patient's most appropriate activity status/level should be described as:     0    Normal activity, no symptoms     1    Restricted in physical strenuous activity but ambulatory, able to do out light work     2    Ambulatory and capable of self  care, unable to do work activities, up and about more than 50%  Of the time                                3    Only limited self care, in bed greater than 50% of waking hours     4    Completely disabled, no self care, confined to bed or chair     5    Moribund  Past Medical History:  Diagnosis Date  . Hypertension   . Rheumatoid arthritis (HCC)   . Tobacco abuse     Past Surgical History:  Procedure Laterality Date  . INTRAVASCULAR PRESSURE WIRE/FFR STUDY N/A 10/13/2020   Procedure: INTRAVASCULAR PRESSURE WIRE/FFR STUDY;  Surgeon: Lyn Records, MD;  Location: [facility] INVASIVE CV LAB;  Service: Cardiovascular;  Laterality: N/A;  . LEFT HEART CATH AND CORONARY ANGIOGRAPHY N/A 10/13/2020   Procedure: LEFT HEART CATH AND CORONARY ANGIOGRAPHY;  Surgeon: Lyn Records, MD;  Location: [facility] INVASIVE CV LAB;  Service: Cardiovascular;  Laterality: N/A;  Left knee surgery Removal of fibroids Hysterectomy  Social History   Tobacco Use  Smoking Status Current Some Day Smoker  Smokeless Tobacco Never Used  Tobacco Comment   pt is trying to quit smoking    Social History   Substance and Sexual Activity  Alcohol Use Yes  . Alcohol/week: 2.0  standard drinks  . Types: 2 Cans of beer per week   Comment: 2-3 cans of beer a day    Allergies  Allergen Reactions  . Codeine Anaphylaxis    Current Facility-Administered Medications  Medication Dose Route Frequency Provider Last Rate Last Admin  . 0.9 %  sodium chloride infusion  250 mL Intravenous PRN Bhagat, Bhavinkumar, PA      . 0.9% sodium chloride infusion  1 mL/kg/hr Intravenous Continuous Bhagat, Bhavinkumar, PA 66.7 mL/hr at 10/13/20 0500 1 mL/kg/hr at 10/13/20 0500  . acetaminophen (TYLENOL) tablet 650 mg  650 mg Oral Q4H PRN Bhagat, Bhavinkumar, PA      . aspirin EC tablet 81 mg  81 mg Oral Daily Bhagat, Bhavinkumar, PA   81 mg at 10/13/20 0902  . atorvastatin (LIPITOR) tablet 80 mg  80 mg Oral Daily Bhagat, Bhavinkumar, PA   80  mg at 10/13/20 0902  . carvedilol (COREG) tablet 12.5 mg  12.5 mg Oral BID WC Chandrasekhar, Mahesh A, MD      . heparin ADULT infusion 100 units/mL (25000 units/248mL)  1,050 Units/hr Intravenous Continuous Silvana Newness, RPH      . isosorbide mononitrate (IMDUR) 24 hr tablet 30 mg  30 mg Oral Daily Chandrasekhar, Mahesh A, MD   30 mg at 10/13/20 0902  . lisinopril (ZESTRIL) tablet 2.5 mg  2.5 mg Oral Daily Chandrasekhar, Mahesh A, MD      . nitroGLYCERIN (NITROSTAT) SL tablet 0.4 mg  0.4 mg Sublingual Q5 Min x 3 PRN Bhagat, Bhavinkumar, PA      . ondansetron (ZOFRAN) injection 4 mg  4 mg Intravenous Q6H PRN Bhagat, Bhavinkumar, PA      . sodium chloride flush (NS) 0.9 % injection 3 mL  3 mL Intravenous Q12H Bhagat, Bhavinkumar, PA   3 mL at 10/13/20 0914  . sodium chloride flush (NS) 0.9 % injection 3 mL  3 mL Intravenous PRN Bhagat, Bhavinkumar, PA        Medications Prior to Admission  Medication Sig Dispense Refill Last Dose  . lisinopril (ZESTRIL) 30 MG tablet Take 30 mg by mouth daily.   10/12/2020 at Unknown time  . meloxicam (MOBIC) 15 MG tablet Take 1 tablet by mouth daily.   10/12/2020 at Unknown time    Family History  Problem Relation Age of Onset  . Breast cancer Mother   . Heart failure Mother   Father deceased at age 34   Review of Systems:      Cardiac Review of Systems: Y or  [  N  ]= no  Chest Pain [  N  ]  Resting SOB [ N  ] Exertional SOB  Klaus.Mock  ]  Orthopnea [ N ]   Pedal Edema [  N ]    Palpitations [ N ] Syncope  [ N ]   Presyncope [ N  ]  General RevNiew of Systems: [Y] = yes [  N]=no Constitional: recent weight change [ Y-purposeful weight loss ]; nausea [ N ]; night sweats Klaus.Mock  ]; fever Klaus.Mock  ]; or chills [ N ]                                                                Eye :  Amaurosis  fugax[ N ]; Resp: cough [ N ];  wheezing[ N ];  hemoptysis[N  ];  GI:  vomiting[ N ];   melena[N  ];  hematochezia Klaus.Mock  ];  GU: hematuria[N ];                Heme/Lymph:   anemia[ N ];  Neuro: TIA[ N ];  headaches[  ];  stroke[ N ];  vertigo[ N ];  seizures[N  ];    Endocrine: pre diabetes[ Y ];  thyroid dysfunction[N  ];                 Physical Exam: BP (!) 165/88   Pulse 63   Temp 97.9 F (36.6 C) (Oral)   Resp 16   Ht 5\' 3"  (1.6 m)   Wt 65.3 kg   SpO2 100%   BMI 25.51 kg/m    General appearance: alert, cooperative and no distress Head: Normocephalic, without obvious abnormality, atraumatic Neck: no carotid bruit, no JVD and supple, symmetrical, trachea midline Resp: clear to auscultation bilaterally Cardio: RRR, no murmur GI: Soft, non tender bowel sounds present Extremities: No LE edema. Palpable DP/PT bilaterally Neurologic: Grossly normal  Diagnostic Studies & Laboratory data:     Recent Radiology Findings:   DG Chest 2 View  Result Date: 10/12/2020 CLINICAL DATA:  Chest pain EXAM: CHEST - 2 VIEW COMPARISON:  March 19, 2009 FINDINGS: The lungs are clear. The heart size and pulmonary vascularity are normal. No adenopathy. There is lower thoracic dextroscoliosis. IMPRESSION: Lungs clear.  Heart size normal. Electronically Signed   By: March 21, 2009 III M.D.   On: 10/12/2020 15:17   CARDIAC CATHETERIZATION  Result Date: 10/13/2020  Culprit lesion is a large branching ramus intermedius and has ostial to proximal narrowing without landing zone for PCI.  Left main is widely patent  LAD proximal to mid segment with intermediate stenosis in the 65 to 75% range documented to be hemodynamically significant by both RFR (0.82) and FFR (0.72).  Circumflex is relatively small.  There is a moderate-sized third obtuse marginal.  The mid vessel contains eccentric 50 to 60% narrowing.  RCA is dominant.  There is significant tortuosity.  The distal RCA before the PDA contains segmental 50% narrowing.  The ostial to proximal PDA is 60-70% narrowed.  Overall LV function is normal with EF 60%. RECOMMENDATIONS:  TCTS consultation.  There is  three-vessel coronary artery disease in this patient who smokes, has significant hypertension, is prediabetic, and hyperlipidemia.  PCI on ramus intermedius would require overhang of stent into the distal left main.  ECHOCARDIOGRAM COMPLETE  Result Date: 10/13/2020    ECHOCARDIOGRAM REPORT   Patient Name:   April Bryant Date of Exam: 10/13/2020 Medical Rec #:  12/13/2020        Height:       63.0 in Accession #:    [id number]       Weight:       144.0 lb Date of Birth:  1961-01-16        BSA:          1.682 m Patient Age:    60 years         BP:           182/94 mmHg Patient Gender: F                HR:           60 bpm. Exam Location:  Inpatient Procedure: 2D Echo,  Cardiac Doppler and Color Doppler Indications:    NSTEMI  History:        Patient has no prior history of Echocardiogram examinations.                 Risk Factors:Hypertension.  Sonographer:    Neomia Dear RDCS Referring Phys: [id number] Manson Passey  Sonographer Comments: 10/13/20 cath IMPRESSIONS  1. Left ventricular ejection fraction, by estimation, is 55 to 60%. The left ventricle has normal function. The left ventricle has no regional wall motion abnormalities. Left ventricular diastolic parameters are consistent with Grade II diastolic dysfunction (pseudonormalization).  2. Right ventricular systolic function is normal. The right ventricular size is normal. There is moderately elevated pulmonary artery systolic pressure.  3. Left atrial size was mild to moderately dilated.  4. The mitral valve is normal in structure. Moderate mitral valve regurgitation. No evidence of mitral stenosis.  5. The aortic valve is normal in structure. Aortic valve regurgitation is not visualized. No aortic stenosis is present. FINDINGS  Left Ventricle: Left ventricular ejection fraction, by estimation, is 55 to 60%. The left ventricle has normal function. The left ventricle has no regional wall motion abnormalities. The left ventricular internal cavity size  was normal in size. There is  no left ventricular hypertrophy. Left ventricular diastolic parameters are consistent with Grade II diastolic dysfunction (pseudonormalization). Right Ventricle: The right ventricular size is normal. No increase in right ventricular wall thickness. Right ventricular systolic function is normal. There is moderately elevated pulmonary artery systolic pressure. The tricuspid regurgitant velocity is 3.76 m/s, and with an assumed right atrial pressure of 3 mmHg, the estimated right ventricular systolic pressure is 59.6 mmHg. Left Atrium: Left atrial size was mild to moderately dilated. Right Atrium: Right atrial size was normal in size. Pericardium: There is no evidence of pericardial effusion. Mitral Valve: The mitral valve is normal in structure. Moderate mitral valve regurgitation. No evidence of mitral valve stenosis. Tricuspid Valve: The tricuspid valve is normal in structure. Tricuspid valve regurgitation is not demonstrated. No evidence of tricuspid stenosis. Aortic Valve: The aortic valve is normal in structure. Aortic valve regurgitation is not visualized. Aortic regurgitation PHT measures 730 msec. No aortic stenosis is present. Aortic valve mean gradient measures 5.0 mmHg. Aortic valve peak gradient measures 8.9 mmHg. Aortic valve area, by VTI measures 1.42 cm. Pulmonic Valve: The pulmonic valve was normal in structure. Pulmonic valve regurgitation is not visualized. Aorta: The aortic root and ascending aorta are structurally normal, with no evidence of dilitation. IAS/Shunts: The atrial septum is grossly normal.  LEFT VENTRICLE PLAX 2D LVIDd:         4.60 cm     Diastology LVIDs:         2.80 cm     LV e' medial:    5.87 cm/s LV PW:         1.10 cm     LV E/e' medial:  15.8 LV IVS:        1.40 cm     LV e' lateral:   4.57 cm/s LVOT diam:     2.00 cm     LV E/e' lateral: 20.3 LV SV:         47 LV SV Index:   28 LVOT Area:     3.14 cm  LV Volumes (MOD) LV vol d, MOD A2C: 68.9 ml  LV vol d, MOD A4C: 83.3 ml LV vol s, MOD A2C: 41.1 ml LV vol s, MOD A4C: 53.5 ml LV  SV MOD A2C:     27.8 ml LV SV MOD A4C:     83.3 ml LV SV MOD BP:      27.4 ml  PULMONARY VEINS A Reversal Duration: 108.00 msec A Reversal Velocity: 26.20 cm/s Diastolic Velocity:  49.40 cm/s S/D Velocity:        1.20 Systolic Velocity:   60.80 cm/s LEFT ATRIUM             Index       RIGHT ATRIUM          Index LA diam:        4.00 cm 2.38 cm/m  RA Area:     8.92 cm LA Vol (A2C):   49.1 ml 29.20 ml/m RA Volume:   18.50 ml 11.00 ml/m LA Vol (A4C):   92.7 ml 55.12 ml/m LA Biplane Vol: 68.1 ml 40.50 ml/m  AORTIC VALVE                    PULMONIC VALVE AV Area (Vmax):    1.58 cm     PV Vmax:       0.95 m/s AV Area (Vmean):   1.58 cm     PV Vmean:      72.700 cm/s AV Area (VTI):     1.42 cm     PV VTI:        0.222 m AV Vmax:           149.00 cm/s  PV Peak grad:  3.6 mmHg AV Vmean:          103.400 cm/s PV Mean grad:  2.0 mmHg AV VTI:            0.332 m AV Peak Grad:      8.9 mmHg AV Mean Grad:      5.0 mmHg LVOT Vmax:         74.75 cm/s LVOT Vmean:        52.100 cm/s LVOT VTI:          0.150 m LVOT/AV VTI ratio: 0.45 AI PHT:            730 msec  AORTA Ao Root diam: 3.30 cm Ao Asc diam:  2.90 cm MITRAL VALVE                 TRICUSPID VALVE MV Area (PHT): 3.50 cm      TR Peak grad:   56.6 mmHg MV Decel Time: 217 msec      TR Vmax:        376.00 cm/s MR Peak grad:    124.3 mmHg MR Mean grad:    84.5 mmHg   SHUNTS MR Vmax:         557.50 cm/s Systemic VTI:  0.15 m MR Vmean:        441.0 cm/s  Systemic Diam: 2.00 cm MR PISA:         0.77 cm MR PISA Eff ROA: 3 mm MR PISA Radius:  0.35 cm MV E velocity: 92.70 cm/s MV A velocity: 112.00 cm/s MV E/A ratio:  0.83 Kristeen Miss MD Electronically signed by Kristeen Miss MD Signature Date/Time: 10/13/2020/11:52:15 AM    Final      I have independently reviewed the above radiologic studies and discussed with the patient   Recent Lab Findings: Lab Results  Component Value Date   WBC  5.8 10/13/2020   HGB 13.0 10/13/2020   HCT 40.7 10/13/2020   PLT 130 (  L) 10/13/2020   GLUCOSE 108 (H) 10/13/2020   CHOL 216 (H) 10/13/2020   TRIG 82 10/13/2020   HDL 66 10/13/2020   LDLCALC 134 (H) 10/13/2020   NA 139 10/13/2020   K 3.5 10/13/2020   CL 108 10/13/2020   CREATININE 0.66 10/13/2020   BUN 8 10/13/2020   CO2 22 10/13/2020   HGBA1C 5.9 (H) 10/12/2020   Assessment / Plan:   1. S/p NSTEMI, coronary artery disease-Dr. Cornelius Moras to evaluate for coronary artery bypass grafting surgery, which appears to likely be her best option 2. Moderate MR-seen on echo but Dr. Cornelius Moras to review  3. History of hypertension-on Lisinopril prior to admission. Is also on Carvedilol 12.5 mg bid now. 4. History of tobacco abuse-encouraged tobacco cessation 5. History of rheumatoid arthritis 6. Pre diabetes-HGA1C 5.9. Will provide nutritional information and will need to follow up with medical doctor after discharge.   I  spent 20 minutes counseling the patient face to face.   Doree Fudge PA-C 10/13/2020 2:06 PM   I have seen and examined the patient and agree with the assessment as outlined above by Doree Fudge, PA-C.  Patient is a 55-year-old female with no previous history of coronary artery disease but risk factors notable for history of hypertension and longstanding tobacco abuse.  She describes a several month history of intermittent episodes of tightness in the upper chest and neck which previously had occurred primarily with physical activity.  Recently she has had a couple of episodes at rest including a particular severe episode that occurred yesterday, prompting her to summon EMS.  Chest pain resolved prior to transport to the hospital.  Baseline EKG revealed nonspecific ST changes but troponins were positive and trended up, consistent with non-ST segment elevation myocardial infarction.  The patient has not had any recurrent chest pain since admission.  I have personally reviewed  the patient's transthoracic echocardiogram and diagnostic cardiac catheterization.  Echocardiogram demonstrates normal left ventricular size and systolic function.  In my opinion there is only mild mitral regurgitation.  Diagnostic cardiac catheterization revealed severe three-vessel coronary artery disease with long segment 95% proximal stenosis of a very large intermediate branch vessel that likely represents the culprit lesion prompting the patient's acute presentation.  The patient has good target vessels for bypass grafting and coronary anatomy relatively unfavorable for percutaneous core intervention.  Left ventricular function appears normal.  I agree the patient would best be treated with surgical revascularization.  I have reviewed the indications, risks, and potential benefits of coronary artery bypass grafting with the patient and her wife at the bedside.  Alternative treatment strategies have been discussed, including the relative risks, benefits and long term prognosis associated with medical therapy, percutaneous coronary intervention, and surgical revascularization.  The patient understands and accepts all potential associated risks of surgery including but not limited to risk of death, stroke or other neurologic complication, myocardial infarction, congestive heart failure, respiratory failure, renal failure, bleeding requiring blood transfusion and/or reexploration, aortic dissection or other major vascular complication, arrhythmia, heart block or bradycardia requiring permanent pacemaker, pneumonia, pleural effusion, wound infection, pulmonary embolus or other thromboembolic complication, chronic pain or other delayed complications related to median sternotomy, or the late recurrence of symptomatic ischemic heart disease and/or congestive heart failure.  The importance of long term risk modification have been emphasized.  All questions answered.  We plan coronary artery bypass grafting on Friday,  October 16, 2020.       I spent in excess of 60 minutes  during the conduct of this hospital encounter and >50% of this time involved direct face-to-face encounter with the patient for counseling and/or coordination of their care.    Purcell Nails, MD 10/13/2020 3:17 PM

## 2020-10-13 NOTE — Progress Notes (Signed)
ANTICOAGULATION CONSULT NOTE Pharmacy Consult for heparin Indication: chest pain/ACS  Allergies  Allergen Reactions  . Codeine Anaphylaxis    Patient Measurements: Height: 5\' 3"  (160 cm) Weight: 65.4 kg (144 lb 1.6 oz) IBW/kg (Calculated) : 52.4 Heparin Dosing Weight: 65.9 kg  Vital Signs: Temp: 97.8 F (36.6 C) (04/12 0019) Temp Source: Oral (04/12 0019) BP: 155/87 (04/12 0019) Pulse Rate: 74 (04/12 0019)  Labs: Recent Labs    10/12/20 1631 10/12/20 1737 10/12/20 1928 10/12/20 2212 10/13/20 0145  HGB 14.5  --   --   --  13.0  HCT 45.6  --   --   --  40.7  PLT 120*  --   --   --  130*  HEPARINUNFRC  --   --   --   --  0.15*  CREATININE 0.87  --   --   --  0.66  TROPONINIHS 1,577* 3,345* 7,755* 9,893*  --     Estimated Creatinine Clearance: 68 mL/min (by C-G formula based on SCr of 0.66 mg/dL).  Assessment: 60 y.o. female with chest pain for heparin   Goal of Therapy:  Heparin level 0.3-0.7 units/ml Monitor platelets by anticoagulation protocol: Yes   Plan:  Heparin 2000 units IV bolus, then increase heparin  1050 units/hr Follow up after cath today   67, PharmD, BCPS

## 2020-10-13 NOTE — Telephone Encounter (Signed)
To Medical Records.

## 2020-10-13 NOTE — Progress Notes (Signed)
ANTICOAGULATION CONSULT NOTE Pharmacy Consult for heparin Indication: chest pain/ACS  Allergies  Allergen Reactions  . Codeine Anaphylaxis    Patient Measurements: Height: 5\' 3"  (160 cm) Weight: 65.3 kg (144 lb) IBW/kg (Calculated) : 52.4 Heparin Dosing Weight: 65.9 kg  Vital Signs: Temp: 97.9 F (36.6 C) (04/12 1229) Temp Source: Oral (04/12 1229) BP: 165/88 (04/12 1328) Pulse Rate: 63 (04/12 1328)  Labs: Recent Labs    10/12/20 1631 10/12/20 1737 10/12/20 1928 10/12/20 2212 10/13/20 0145  HGB 14.5  --   --   --  13.0  HCT 45.6  --   --   --  40.7  PLT 120*  --   --   --  130*  HEPARINUNFRC  --   --   --   --  0.15*  CREATININE 0.87  --   --   --  0.66  TROPONINIHS 1,577* 3,345* 7,755* 9,893*  --     Estimated Creatinine Clearance: 68 mL/min (by C-G formula based on SCr of 0.66 mg/dL).  Assessment: 60 y.o. female with NSTEMI s/p cath for CABG evaluation. Heparin to restart 8 hours after sheath removal (removed ~ 12pm)   Goal of Therapy:  Heparin level 0.3-0.7 units/ml Monitor platelets by anticoagulation protocol: Yes   Plan:  -Resume heparin 1050 units/hr at 8pm -Daily heparin level and CBC  67, PharmD Clinical Pharmacist **Pharmacist phone directory can now be found on [email or web address] ([email or web address]).  Listed under [facility].

## 2020-10-14 ENCOUNTER — Inpatient Hospital Stay (HOSPITAL_COMMUNITY): Payer: 59

## 2020-10-14 DIAGNOSIS — I214 Non-ST elevation (NSTEMI) myocardial infarction: Secondary | ICD-10-CM | POA: Diagnosis not present

## 2020-10-14 DIAGNOSIS — Z01810 Encounter for preprocedural cardiovascular examination: Secondary | ICD-10-CM

## 2020-10-14 LAB — CBC
HCT: 38.2 % (ref 36.0–46.0)
Hemoglobin: 12.2 g/dL (ref 12.0–15.0)
MCH: 24.2 pg — ABNORMAL LOW (ref 26.0–34.0)
MCHC: 31.9 g/dL (ref 30.0–36.0)
MCV: 75.6 fL — ABNORMAL LOW (ref 80.0–100.0)
Platelets: 113 10*3/uL — ABNORMAL LOW (ref 150–400)
RBC: 5.05 MIL/uL (ref 3.87–5.11)
RDW: 15 % (ref 11.5–15.5)
WBC: 6.2 10*3/uL (ref 4.0–10.5)
nRBC: 0 % (ref 0.0–0.2)

## 2020-10-14 LAB — PROTIME-INR
INR: 1 (ref 0.8–1.2)
Prothrombin Time: 13.5 seconds (ref 11.4–15.2)

## 2020-10-14 LAB — HEMOGLOBIN A1C
Hgb A1c MFr Bld: 5.6 % (ref 4.8–5.6)
Mean Plasma Glucose: 114.02 mg/dL

## 2020-10-14 LAB — BLOOD GAS, ARTERIAL
Acid-base deficit: 2.2 mmol/L — ABNORMAL HIGH (ref 0.0–2.0)
Bicarbonate: 21.5 mmol/L (ref 20.0–28.0)
Drawn by: 56037
FIO2: 21
O2 Saturation: 96.8 %
Patient temperature: 36.6
pCO2 arterial: 32.9 mmHg (ref 32.0–48.0)
pH, Arterial: 7.43 (ref 7.350–7.450)
pO2, Arterial: 84.7 mmHg (ref 83.0–108.0)

## 2020-10-14 LAB — COMPREHENSIVE METABOLIC PANEL
ALT: 21 U/L (ref 0–44)
AST: 37 U/L (ref 15–41)
Albumin: 3.6 g/dL (ref 3.5–5.0)
Alkaline Phosphatase: 60 U/L (ref 38–126)
Anion gap: 7 (ref 5–15)
BUN: 12 mg/dL (ref 6–20)
CO2: 22 mmol/L (ref 22–32)
Calcium: 8.9 mg/dL (ref 8.9–10.3)
Chloride: 110 mmol/L (ref 98–111)
Creatinine, Ser: 0.7 mg/dL (ref 0.44–1.00)
GFR, Estimated: >60 mL/min (ref >60)
Glucose, Bld: 110 mg/dL — ABNORMAL HIGH (ref 70–99)
Potassium: 3.5 mmol/L (ref 3.5–5.1)
Sodium: 139 mmol/L (ref 135–145)
Total Bilirubin: 0.7 mg/dL (ref 0.3–1.2)
Total Protein: 5.8 g/dL — ABNORMAL LOW (ref 6.5–8.1)

## 2020-10-14 LAB — PREALBUMIN: Prealbumin: 23.3 mg/dL (ref 18–38)

## 2020-10-14 LAB — HEPARIN LEVEL (UNFRACTIONATED)
Heparin Unfractionated: 0.23 IU/mL — ABNORMAL LOW (ref 0.30–0.70)
Heparin Unfractionated: 0.29 IU/mL — ABNORMAL LOW (ref 0.30–0.70)

## 2020-10-14 LAB — APTT: aPTT: 72 seconds — ABNORMAL HIGH (ref 24–36)

## 2020-10-14 LAB — SURGICAL PCR SCREEN
MRSA, PCR: NEGATIVE
Staphylococcus aureus: NEGATIVE

## 2020-10-14 LAB — ABO/RH: ABO/RH(D): O POS

## 2020-10-14 MED ORDER — LOSARTAN POTASSIUM 25 MG PO TABS
25.0000 mg | ORAL_TABLET | Freq: Every day | ORAL | Status: DC
  Administered 2020-10-14: 25 mg via ORAL
  Filled 2020-10-14: qty 1

## 2020-10-14 MED ORDER — POTASSIUM CHLORIDE CRYS ER 20 MEQ PO TBCR
40.0000 meq | EXTENDED_RELEASE_TABLET | Freq: Once | ORAL | Status: AC
  Administered 2020-10-14: 40 meq via ORAL
  Filled 2020-10-14: qty 2

## 2020-10-14 MED ORDER — CARVEDILOL 25 MG PO TABS
25.0000 mg | ORAL_TABLET | Freq: Two times a day (BID) | ORAL | Status: DC
  Administered 2020-10-14 – 2020-10-15 (×3): 25 mg via ORAL
  Filled 2020-10-14 (×3): qty 1

## 2020-10-14 NOTE — Progress Notes (Addendum)
Progress Note  Patient Name: April Bryant Date of Encounter: 10/14/2020  [facility] Cardiologist: Christell Constant, MD   Subjective   Feeling okay this morning. Still processing plans for CABG on Friday. No recurrent chest pain, SOB, palpitations, or LE edema. She asks if she can shower today - order placed for shower 24 hours post cath. Wife at bedside is requesting to stay the night Thursday night so she can be with the patient prior to surgery Friday morning as they live 40+ min away. Spoke with RN and wife is cleared to stay overnight.   Inpatient Medications    Scheduled Meds: . aspirin  81 mg Oral Daily  . atorvastatin  80 mg Oral Daily  . carvedilol  12.5 mg Oral BID WC  . isosorbide mononitrate  30 mg Oral Daily  . lisinopril  2.5 mg Oral Daily  . sodium chloride flush  3 mL Intravenous Q12H   Continuous Infusions: . sodium chloride    . sodium chloride Stopped (10/13/20 1539)  . heparin 1,150 Units/hr (10/14/20 0514)  . nitroGLYCERIN Stopped (10/13/20 1943)   PRN Meds: sodium chloride, acetaminophen, nitroGLYCERIN, ondansetron (ZOFRAN) IV, sodium chloride flush   Vital Signs    Vitals:   10/13/20 2010 10/13/20 2334 10/14/20 0500 10/14/20 0517  BP: (!) 159/107 (!) 182/101 (!) 158/94   Pulse: 68 63 64   Resp: 19     Temp: 98.5 F (36.9 C) 98.3 F (36.8 C) 97.6 F (36.4 C)   TempSrc: Oral Oral Oral   SpO2: 99% 98% 95%   Weight:    64.5 kg  Height:        Intake/Output Summary (Last 24 hours) at 10/14/2020 0801 Last data filed at 10/14/2020 0517 Gross per 24 hour  Intake 1894.2 ml  Output 1700 ml  Net 194.2 ml   Last 3 Weights 10/14/2020 10/13/2020 10/12/2020  Weight (lbs) 142 lb 1.6 oz 144 lb 144 lb 1.6 oz  Weight (kg) 64.456 kg 65.318 kg 65.363 kg      Telemetry    Sinus rhythm - Personally Reviewed  ECG    Sinus rhythm, rate 68 bpm, LVH with repol abnormalities, TWI in lateral leads.  - Personally Reviewed  Physical Exam   GEN:  No acute distress.   Neck: No JVD Cardiac: RRR, no murmurs, rubs, or gallops.  Respiratory: Clear to auscultation bilaterally. GI: Soft, nontender, non-distended  MS: No edema; No deformity. Neuro:  Nonfocal  Psych: Normal affect   Labs    High Sensitivity Troponin:   Recent Labs  Lab 10/12/20 1631 10/12/20 1737 10/12/20 1928 10/12/20 2212  TROPONINIHS 1,577* 3,345* 7,755* 9,893*      Chemistry Recent Labs  Lab 10/12/20 1631 10/13/20 0145 10/14/20 0232  NA 137 139 139  K 3.9 3.5 3.5  CL 105 108 110  CO2 24 22 22   GLUCOSE 110* 108* 110*  BUN 9 8 12   CREATININE 0.87 0.66 0.70  CALCIUM 9.4 9.0 8.9  PROT  --   --  5.8*  ALBUMIN  --   --  3.6  AST  --   --  37  ALT  --   --  21  ALKPHOS  --   --  60  BILITOT  --   --  0.7  GFRNONAA >60 >60 >60  ANIONGAP 8 9 7      Hematology Recent Labs  Lab 10/12/20 1631 10/13/20 0145 10/14/20 0232  WBC 6.2 5.8 6.2  RBC 5.93* 5.35*  5.05  HGB 14.5 13.0 12.2  HCT 45.6 40.7 38.2  MCV 76.9* 76.1* 75.6*  MCH 24.5* 24.3* 24.2*  MCHC 31.8 31.9 31.9  RDW 14.9 14.9 15.0  PLT 120* 130* 113*    BNPNo results for input(s): BNP, PROBNP in the last 168 hours.   DDimer No results for input(s): DDIMER in the last 168 hours.   Radiology    DG Chest 2 View  Result Date: 10/12/2020 CLINICAL DATA:  Chest pain EXAM: CHEST - 2 VIEW COMPARISON:  March 19, 2009 FINDINGS: The lungs are clear. The heart size and pulmonary vascularity are normal. No adenopathy. There is lower thoracic dextroscoliosis. IMPRESSION: Lungs clear.  Heart size normal. Electronically Signed   By: Bretta Bang III M.D.   On: 10/12/2020 15:17   CARDIAC CATHETERIZATION  Result Date: 10/13/2020  Culprit lesion is a large branching ramus intermedius and has ostial to proximal narrowing without landing zone for PCI.  Left main is widely patent  LAD proximal to mid segment with intermediate stenosis in the 65 to 75% range documented to be hemodynamically  significant by both RFR (0.82) and FFR (0.72).  Circumflex is relatively small.  There is a moderate-sized third obtuse marginal.  The mid vessel contains eccentric 50 to 60% narrowing.  RCA is dominant.  There is significant tortuosity.  The distal RCA before the PDA contains segmental 50% narrowing.  The ostial to proximal PDA is 60-70% narrowed.  Overall LV function is normal with EF 60%. RECOMMENDATIONS:  TCTS consultation.  There is three-vessel coronary artery disease in this patient who smokes, has significant hypertension, is prediabetic, and hyperlipidemia.  PCI on ramus intermedius would require overhang of stent into the distal left main.  ECHOCARDIOGRAM COMPLETE  Result Date: 10/13/2020    ECHOCARDIOGRAM REPORT   Patient Name:   April Bryant Date of Exam: 10/13/2020 Medical Rec #:  [id number]        Height:       63.0 in Accession #:    [id number]       Weight:       144.0 lb Date of Birth:  12/22/1960        BSA:          1.682 m Patient Age:    60 years         BP:           182/94 mmHg Patient Gender: F                HR:           60 bpm. Exam Location:  Inpatient Procedure: 2D Echo, Cardiac Doppler and Color Doppler Indications:    NSTEMI  History:        Patient has no prior history of Echocardiogram examinations.                 Risk Factors:Hypertension.  Sonographer:    Neomia Dear RDCS Referring Phys: [id number] Manson Passey  Sonographer Comments: 10/13/20 cath IMPRESSIONS  1. Left ventricular ejection fraction, by estimation, is 55 to 60%. The left ventricle has normal function. The left ventricle has no regional wall motion abnormalities. Left ventricular diastolic parameters are consistent with Grade II diastolic dysfunction (pseudonormalization).  2. Right ventricular systolic function is normal. The right ventricular size is normal. There is moderately elevated pulmonary artery systolic pressure.  3. Left atrial size was mild to moderately dilated.  4. The mitral valve is  normal in structure. Moderate mitral valve  regurgitation. No evidence of mitral stenosis.  5. The aortic valve is normal in structure. Aortic valve regurgitation is not visualized. No aortic stenosis is present. FINDINGS  Left Ventricle: Left ventricular ejection fraction, by estimation, is 55 to 60%. The left ventricle has normal function. The left ventricle has no regional wall motion abnormalities. The left ventricular internal cavity size was normal in size. There is  no left ventricular hypertrophy. Left ventricular diastolic parameters are consistent with Grade II diastolic dysfunction (pseudonormalization). Right Ventricle: The right ventricular size is normal. No increase in right ventricular wall thickness. Right ventricular systolic function is normal. There is moderately elevated pulmonary artery systolic pressure. The tricuspid regurgitant velocity is 3.76 m/s, and with an assumed right atrial pressure of 3 mmHg, the estimated right ventricular systolic pressure is 59.6 mmHg. Left Atrium: Left atrial size was mild to moderately dilated. Right Atrium: Right atrial size was normal in size. Pericardium: There is no evidence of pericardial effusion. Mitral Valve: The mitral valve is normal in structure. Moderate mitral valve regurgitation. No evidence of mitral valve stenosis. Tricuspid Valve: The tricuspid valve is normal in structure. Tricuspid valve regurgitation is not demonstrated. No evidence of tricuspid stenosis. Aortic Valve: The aortic valve is normal in structure. Aortic valve regurgitation is not visualized. Aortic regurgitation PHT measures 730 msec. No aortic stenosis is present. Aortic valve mean gradient measures 5.0 mmHg. Aortic valve peak gradient measures 8.9 mmHg. Aortic valve area, by VTI measures 1.42 cm. Pulmonic Valve: The pulmonic valve was normal in structure. Pulmonic valve regurgitation is not visualized. Aorta: The aortic root and ascending aorta are structurally normal, with no  evidence of dilitation. IAS/Shunts: The atrial septum is grossly normal.  LEFT VENTRICLE PLAX 2D LVIDd:         4.60 cm     Diastology LVIDs:         2.80 cm     LV e' medial:    5.87 cm/s LV PW:         1.10 cm     LV E/e' medial:  15.8 LV IVS:        1.40 cm     LV e' lateral:   4.57 cm/s LVOT diam:     2.00 cm     LV E/e' lateral: 20.3 LV SV:         47 LV SV Index:   28 LVOT Area:     3.14 cm  LV Volumes (MOD) LV vol d, MOD A2C: 68.9 ml LV vol d, MOD A4C: 83.3 ml LV vol s, MOD A2C: 41.1 ml LV vol s, MOD A4C: 53.5 ml LV SV MOD A2C:     27.8 ml LV SV MOD A4C:     83.3 ml LV SV MOD BP:      27.4 ml  PULMONARY VEINS A Reversal Duration: 108.00 msec A Reversal Velocity: 26.20 cm/s Diastolic Velocity:  49.40 cm/s S/D Velocity:        1.20 Systolic Velocity:   60.80 cm/s LEFT ATRIUM             Index       RIGHT ATRIUM          Index LA diam:        4.00 cm 2.38 cm/m  RA Area:     8.92 cm LA Vol (A2C):   49.1 ml 29.20 ml/m RA Volume:   18.50 ml 11.00 ml/m LA Vol (A4C):   92.7 ml 55.12 ml/m LA Biplane Vol:  68.1 ml 40.50 ml/m  AORTIC VALVE                    PULMONIC VALVE AV Area (Vmax):    1.58 cm     PV Vmax:       0.95 m/s AV Area (Vmean):   1.58 cm     PV Vmean:      72.700 cm/s AV Area (VTI):     1.42 cm     PV VTI:        0.222 m AV Vmax:           149.00 cm/s  PV Peak grad:  3.6 mmHg AV Vmean:          103.400 cm/s PV Mean grad:  2.0 mmHg AV VTI:            0.332 m AV Peak Grad:      8.9 mmHg AV Mean Grad:      5.0 mmHg LVOT Vmax:         74.75 cm/s LVOT Vmean:        52.100 cm/s LVOT VTI:          0.150 m LVOT/AV VTI ratio: 0.45 AI PHT:            730 msec  AORTA Ao Root diam: 3.30 cm Ao Asc diam:  2.90 cm MITRAL VALVE                 TRICUSPID VALVE MV Area (PHT): 3.50 cm      TR Peak grad:   56.6 mmHg MV Decel Time: 217 msec      TR Vmax:        376.00 cm/s MR Peak grad:    124.3 mmHg MR Mean grad:    84.5 mmHg   SHUNTS MR Vmax:         557.50 cm/s Systemic VTI:  0.15 m MR Vmean:        441.0 cm/s   Systemic Diam: 2.00 cm MR PISA:         0.77 cm MR PISA Eff ROA: 3 mm MR PISA Radius:  0.35 cm MV E velocity: 92.70 cm/s MV A velocity: 112.00 cm/s MV E/A ratio:  0.83 Kristeen Miss MD Electronically signed by Kristeen Miss MD Signature Date/Time: 10/13/2020/11:52:15 AM    Final     Cardiac Studies   LHC 10/13/20:  Culprit lesion is a large branching ramus intermedius and has ostial to proximal narrowing without landing zone for PCI.  Left main is widely patent  LAD proximal to mid segment with intermediate stenosis in the 65 to 75% range documented to be hemodynamically significant by both RFR (0.82) and FFR (0.72).  Circumflex is relatively small. There is a moderate-sized third obtuse marginal. The mid vessel contains eccentric 50 to 60% narrowing.  RCA is dominant. There is significant tortuosity. The distal RCA before the PDA contains segmental 50% narrowing. The ostial to proximal PDA is 60-70% narrowed.  Overall LV function is normal with EF 60%.  RECOMMENDATIONS:   TCTS consultation.  There is three-vessel coronary artery disease in this patient who smokes, has significant hypertension, is prediabetic, and hyperlipidemia.  PCI on ramus intermedius would require overhang of stent into the distal left main.    Echocardiogram 10/13/20: 1. Left ventricular ejection fraction, by estimation, is 55 to 60%. The  left ventricle has normal function. The left ventricle has no regional  wall motion abnormalities. Left ventricular diastolic parameters  are  consistent with Grade II diastolic  dysfunction (pseudonormalization).  2. Right ventricular systolic function is normal. The right ventricular  size is normal. There is moderately elevated pulmonary artery systolic  pressure.  3. Left atrial size was mild to moderately dilated.  4. The mitral valve is normal in structure. Moderate mitral valve  regurgitation. No evidence of mitral stenosis.  5. The aortic valve  is normal in structure. Aortic valve regurgitation is  not visualized. No aortic stenosis is present.   Patient Profile     60 y.o. female with a PMH of HTN, RA, and tobacco abuse, who is being followed by cardiology for NSTEMI.  Assessment & Plan    1. NSTEMI in patient without known CAD: patient presented with throat tightness, found to have elevated HsTrop which peaked at 9893. EKG with submm STD in inferolateral leads. She was started on a heparin gtt given c/f ACS. She underwent LHC 10/13/20 which showed multivessel disease. Echocardiogram EF 55-60% and with no WMA. CT Surgery evaluated the patient and recommended CABG, tentatively planned for 10/16/20. - Continue heparin gtt - Continue aspirin - Continue statin - Continue BBlocker - planned to see CT surgery  2. Hypertensive urgency: BP significantly elevated on admission. Still quite high despite increasing carvedilol to 12.5mg  BID, starting lisinopril 2.5mg  daily, and continuing imdur 30mg  daily.  - Will continue carvedilol - Will transition to losartan 25mg  daily for improved BP control given normal Cr. - Continue imdur - Uptitrate above as needed  3. HLD: LDL 134 this admission. Started on atorvastatin 80mg  daily given c/f ACS - Continue atorvastatin - Will need repeat FLP/LFTs in 6-8 weeks  4. Mitral regurgitation: noted to be moderate on echo this admission, though per Dr. Orvan July review he felt more likely mild.  - Continue routine monitoring outpatient   5. Pre-DM type 2: A1C 5.9 this admission - Continue lifestyle/dietary modifications to prevent progression.   6. Tobacco abuse: smoking 1/2 ppd for 30+ years - continue to encourage cessation.    For questions or updates, please contact [facility] Please consult [email or web address] for contact info under        Signed, Beatriz Stallion, PA-C  10/14/2020, 8:01 AM    Personally seen and examined. Agree with APP above with the following comments: Briefly 60 yo  F with a history of HTN, HLD, and tobacco use with NSTEMI planned for CABG 10/16/20. Patient notes no chest pain since; is happy to be walking around.  Has logistics questions on who can be around peri-surgery Exam notable for elevated BP 168/90.   Labs notable for stable creatinine and A1c of 5.6 Personally reviewed relevant tests- no significant arrhythmia on telemetry. Would recommend  - increase antihypertensives as above for BP control - planned for CABG 10/16/20 - Answered questions for patient and wife; if we are able to have her son see patient prior to surgery, this would be helpful as there is a family history of post cath stroke in the family and family is nervous.  Riley Lam, MD Cardiologist [facility]  [address] [place], [address] [place], [place] [address] [phone]  12:03 PM

## 2020-10-14 NOTE — Progress Notes (Signed)
ANTICOAGULATION CONSULT NOTE Pharmacy Consult for heparin Indication: chest pain/ACS, awaiting CABG  Allergies  Allergen Reactions  . Codeine Anaphylaxis    Patient Measurements: Height: 5\' 3"  (160 cm) Weight: 65.3 kg (144 lb) IBW/kg (Calculated) : 52.4 Heparin Dosing Weight: 65.9 kg  Vital Signs: Temp: 98.3 F (36.8 C) (04/12 2334) Temp Source: Oral (04/12 2334) BP: 182/101 (04/12 2334) Pulse Rate: 63 (04/12 2334)  Labs: Recent Labs    10/12/20 1631 10/12/20 1737 10/12/20 1928 10/12/20 2212 10/13/20 0145 10/14/20 0232 10/14/20 0324  HGB 14.5  --   --   --  13.0 12.2  --   HCT 45.6  --   --   --  40.7 38.2  --   PLT 120*  --   --   --  130* 113*  --   APTT  --   --   --   --   --  72*  --   LABPROT  --   --   --   --   --  13.5  --   INR  --   --   --   --   --  1.0  --   HEPARINUNFRC  --   --   --   --  0.15*  --  0.23*  CREATININE 0.87  --   --   --  0.66 0.70  --   TROPONINIHS 1,577* 3,345* 7,75504/15/22*  --   --   --     Estimated Creatinine Clearance: 68 mL/min (by C-G formula based on SCr of 0.7 mg/dL).  Assessment: 60 y.o. female with NSTEMI s/p cath for CABG evaluation. Heparin to restart 8 hours after sheath removal (removed ~ 12pm)  4/13 AM update:  Heparin level below goal after re-start s/p cath CABG 4/15 No issues per RN  Goal of Therapy:  Heparin level 0.3-0.7 units/ml Monitor platelets by anticoagulation protocol: Yes   Plan:  Inc heparin to 1150 units/hr 1300 heparin level  5/15, PharmD, BCPS Clinical Pharmacist Phone: [phone]

## 2020-10-14 NOTE — Progress Notes (Signed)
Pre-CABG study completed.  ° °Please see CV Proc for preliminary results.  ° °Daksha Koone, RDMS, RVT ° °

## 2020-10-14 NOTE — Progress Notes (Signed)
CARDIAC REHAB PHASE I   PRE:  Rate/Rhythm: 68 SR    BP: sitting 151/88    SaO2:   MODE:  Ambulation: 470 ft   POST:  Rate/Rhythm: 84 SR    BP: sitting 186/97     SaO2:   BP elevated but pt sts this is good for her. Ambulated well, no CP/throat tightness. Pt can ambulate independently.  Discussed IS (2000 mL), sternal precautions, mobility post op and d/c planning with pt, wife, and friend. Receptive. Gave ed materials. Practicing IS. [id number]   Harriet Masson CES, ACSM 10/14/2020 11:10 AM

## 2020-10-14 NOTE — Progress Notes (Signed)
ANTICOAGULATION CONSULT NOTE Pharmacy Consult for heparin Indication: chest pain/ACS, awaiting CABG  Allergies  Allergen Reactions  . Codeine Anaphylaxis    Patient Measurements: Height: 5\' 3"  (160 cm) Weight: 64.5 kg (142 lb 1.6 oz) IBW/kg (Calculated) : 52.4 Heparin Dosing Weight: 65.9 kg  Vital Signs: Temp: 97.6 F (36.4 C) (04/13 0500) Temp Source: Oral (04/13 0500) BP: 181/106 (04/13 0933) Pulse Rate: 64 (04/13 0500)  Labs: Recent Labs    10/12/20 1631 10/12/20 1737 10/12/20 1928 10/12/20 2212 10/13/20 0145 10/14/20 0232 10/14/20 0324 10/14/20 1233  HGB 14.5  --   --   --  13.0 12.2  --   --   HCT 45.6  --   --   --  40.7 38.2  --   --   PLT 120*  --   --   --  130* 113*  --   --   APTT  --   --   --   --   --  72*  --   --   LABPROT  --   --   --   --   --  13.5  --   --   INR  --   --   --   --   --  1.0  --   --   HEPARINUNFRC  --   --   --   --  0.15*  --  0.23* 0.29*  CREATININE 0.87  --   --   --  0.66 0.70  --   --   TROPONINIHS 1,577* 3,345* 7,75504/15/22*  --   --   --   --     Estimated Creatinine Clearance: 67.5 mL/min (by C-G formula based on SCr of 0.7 mg/dL).  Assessment: 60 y.o. female with NSTEMI s/p cath for CABG evaluation. Heparin restarted post cath 4/12 and plans for CABG on 4/15 -heparin level slightly below goal  Goal of Therapy:  Heparin level 0.3-0.7 units/ml Monitor platelets by anticoagulation protocol: Yes   Plan:  -Increase heparin to 1250 units/hr -Daily heparin level and CNC  5/15, PharmD Clinical Pharmacist **Pharmacist phone directory can now be found on [email or web address] ([email or web address]).  Listed under [facility].

## 2020-10-15 ENCOUNTER — Inpatient Hospital Stay (HOSPITAL_COMMUNITY): Payer: 59

## 2020-10-15 DIAGNOSIS — I214 Non-ST elevation (NSTEMI) myocardial infarction: Secondary | ICD-10-CM | POA: Diagnosis not present

## 2020-10-15 LAB — TYPE AND SCREEN
ABO/RH(D): O POS
Antibody Screen: NEGATIVE

## 2020-10-15 LAB — BASIC METABOLIC PANEL
Anion gap: 7 (ref 5–15)
BUN: 10 mg/dL (ref 6–20)
CO2: 24 mmol/L (ref 22–32)
Calcium: 9.1 mg/dL (ref 8.9–10.3)
Chloride: 110 mmol/L (ref 98–111)
Creatinine, Ser: 0.84 mg/dL (ref 0.44–1.00)
GFR, Estimated: >60 mL/min (ref >60)
Glucose, Bld: 90 mg/dL (ref 70–99)
Potassium: 3.9 mmol/L (ref 3.5–5.1)
Sodium: 141 mmol/L (ref 135–145)

## 2020-10-15 LAB — CBC
HCT: 39.1 % (ref 36.0–46.0)
Hemoglobin: 12.4 g/dL (ref 12.0–15.0)
MCH: 24.2 pg — ABNORMAL LOW (ref 26.0–34.0)
MCHC: 31.7 g/dL (ref 30.0–36.0)
MCV: 76.4 fL — ABNORMAL LOW (ref 80.0–100.0)
Platelets: 111 10*3/uL — ABNORMAL LOW (ref 150–400)
RBC: 5.12 MIL/uL — ABNORMAL HIGH (ref 3.87–5.11)
RDW: 14.9 % (ref 11.5–15.5)
WBC: 6.3 10*3/uL (ref 4.0–10.5)
nRBC: 0 % (ref 0.0–0.2)

## 2020-10-15 LAB — HEPARIN LEVEL (UNFRACTIONATED): Heparin Unfractionated: 0.54 IU/mL (ref 0.30–0.70)

## 2020-10-15 MED ORDER — SODIUM CHLORIDE 0.9 % IV SOLN
INTRAVENOUS | Status: DC
  Filled 2020-10-15: qty 30

## 2020-10-15 MED ORDER — MILRINONE LACTATE IN DEXTROSE 20-5 MG/100ML-% IV SOLN
0.3000 ug/kg/min | INTRAVENOUS | Status: DC
  Filled 2020-10-15: qty 100

## 2020-10-15 MED ORDER — TEMAZEPAM 15 MG PO CAPS: 15.0000 mg | ORAL_CAPSULE | Freq: Once | ORAL | Status: DC | PRN

## 2020-10-15 MED ORDER — POTASSIUM CHLORIDE 2 MEQ/ML IV SOLN
80.0000 meq | INTRAVENOUS | Status: DC
  Filled 2020-10-15: qty 40

## 2020-10-15 MED ORDER — TRANEXAMIC ACID (OHS) PUMP PRIME SOLUTION
2.0000 mg/kg | INTRAVENOUS | Status: DC
  Filled 2020-10-15: qty 1.31

## 2020-10-15 MED ORDER — TRANEXAMIC ACID 1000 MG/10ML IV SOLN
1.5000 mg/kg/h | INTRAVENOUS | Status: AC
  Administered 2020-10-16: 1.5 mg/kg/h via INTRAVENOUS
  Filled 2020-10-15: qty 25

## 2020-10-15 MED ORDER — NITROGLYCERIN IN D5W 200-5 MCG/ML-% IV SOLN
2.0000 ug/min | INTRAVENOUS | Status: AC
Start: 2020-10-16 — End: 2020-10-16
  Administered 2020-10-16: 5 ug/min via INTRAVENOUS
  Filled 2020-10-15: qty 250

## 2020-10-15 MED ORDER — VANCOMYCIN HCL 1250 MG/250ML IV SOLN
1250.0000 mg | INTRAVENOUS | Status: AC
  Administered 2020-10-16: 1250 mg via INTRAVENOUS
  Filled 2020-10-15: qty 250

## 2020-10-15 MED ORDER — LOSARTAN POTASSIUM 50 MG PO TABS
50.0000 mg | ORAL_TABLET | Freq: Every day | ORAL | Status: DC
  Administered 2020-10-15: 50 mg via ORAL
  Filled 2020-10-15: qty 1

## 2020-10-15 MED ORDER — EPINEPHRINE HCL 5 MG/250ML IV SOLN IN NS
0.0000 ug/min | INTRAVENOUS | Status: DC
  Filled 2020-10-15: qty 250

## 2020-10-15 MED ORDER — CHLORHEXIDINE GLUCONATE 0.12 % MT SOLN
15.0000 mL | Freq: Once | OROMUCOSAL | Status: AC
  Administered 2020-10-16: 15 mL via OROMUCOSAL
  Filled 2020-10-15: qty 15

## 2020-10-15 MED ORDER — CHLORHEXIDINE GLUCONATE 4 % EX LIQD
60.0000 mL | Freq: Once | CUTANEOUS | Status: AC
  Administered 2020-10-15: 4 via TOPICAL
  Filled 2020-10-15: qty 60

## 2020-10-15 MED ORDER — DEXMEDETOMIDINE HCL IN NACL 400 MCG/100ML IV SOLN
0.1000 ug/kg/h | INTRAVENOUS | Status: AC
  Administered 2020-10-16: .5 ug/kg/h via INTRAVENOUS
  Filled 2020-10-15: qty 100

## 2020-10-15 MED ORDER — BISACODYL 5 MG PO TBEC: 5.0000 mg | DELAYED_RELEASE_TABLET | Freq: Once | ORAL | Status: DC

## 2020-10-15 MED ORDER — PLASMA-LYTE 148 IV SOLN
INTRAVENOUS | Status: DC
  Filled 2020-10-15: qty 2.5

## 2020-10-15 MED ORDER — TRANEXAMIC ACID (OHS) BOLUS VIA INFUSION
15.0000 mg/kg | INTRAVENOUS | Status: AC
  Administered 2020-10-16: 979.5 mg via INTRAVENOUS
  Filled 2020-10-15: qty 980

## 2020-10-15 MED ORDER — SODIUM CHLORIDE 0.9 % IV SOLN
1.5000 g | INTRAVENOUS | Status: AC
  Administered 2020-10-16: 1.5 g via INTRAVENOUS
  Filled 2020-10-15: qty 1.5

## 2020-10-15 MED ORDER — INSULIN REGULAR(HUMAN) IN NACL 100-0.9 UT/100ML-% IV SOLN
INTRAVENOUS | Status: AC
  Administered 2020-10-16: 1.1 [IU]/h via INTRAVENOUS
  Filled 2020-10-15: qty 100

## 2020-10-15 MED ORDER — VANCOMYCIN HCL 1000 MG IV SOLR
INTRAVENOUS | Status: DC
  Filled 2020-10-15: qty 1000

## 2020-10-15 MED ORDER — SODIUM CHLORIDE 0.9 % IV SOLN
750.0000 mg | INTRAVENOUS | Status: AC
  Administered 2020-10-16: 750 mg via INTRAVENOUS
  Filled 2020-10-15: qty 750

## 2020-10-15 MED ORDER — PHENYLEPHRINE HCL-NACL 20-0.9 MG/250ML-% IV SOLN
30.0000 ug/min | INTRAVENOUS | Status: AC
  Administered 2020-10-16: 20 ug/min via INTRAVENOUS
  Filled 2020-10-15: qty 250

## 2020-10-15 MED ORDER — NOREPINEPHRINE 4 MG/250ML-% IV SOLN
0.0000 ug/min | INTRAVENOUS | Status: DC
  Filled 2020-10-15: qty 250

## 2020-10-15 MED ORDER — MAGNESIUM SULFATE 50 % IJ SOLN
40.0000 meq | INTRAMUSCULAR | Status: DC
  Filled 2020-10-15: qty 9.85

## 2020-10-15 MED ORDER — CHLORHEXIDINE GLUCONATE 4 % EX LIQD
60.0000 mL | Freq: Once | CUTANEOUS | Status: AC
  Administered 2020-10-16: 4 via TOPICAL
  Filled 2020-10-15: qty 60

## 2020-10-15 MED ORDER — METOPROLOL TARTRATE 12.5 MG HALF TABLET
12.5000 mg | ORAL_TABLET | Freq: Once | ORAL | Status: AC
  Administered 2020-10-16: 12.5 mg via ORAL
  Filled 2020-10-15: qty 1

## 2020-10-15 NOTE — Progress Notes (Signed)
      [address]       [place] [address]             [phone]     CARDIOTHORACIC SURGERY PROGRESS NOTE  2 Days Post-Op  S/P Procedure(s) (LRB): LEFT HEART CATH AND CORONARY ANGIOGRAPHY (N/A) INTRAVASCULAR PRESSURE WIRE/FFR STUDY (N/A)  Subjective: No chest pain or SOB  Objective: Vital signs in last 24 hours: Temp:  [97.9 F (36.6 C)-98.3 F (36.8 C)] 97.9 F (36.6 C) (04/14 1157) Pulse Rate:  [62-75] 63 (04/14 1157) Cardiac Rhythm: Normal sinus rhythm (04/14 0835) Resp:  [16-18] 18 (04/14 1157) BP: (118-181)/(73-106) 118/73 (04/14 1200) SpO2:  [96 %-98 %] 98 % (04/14 1157) Weight:  [65.3 kg] 65.3 kg (04/14 0404)  Physical Exam:  Rhythm:   sinus  Breath sounds: clear  Heart sounds:  RRR  Incisions:  n/a  Abdomen:  soft  Extremities:  warm   Intake/Output from previous day: 04/13 0701 - 04/14 0700 In: 360 [P.O.:360] Out: 2100 [Urine:2100] Intake/Output this shift: Total I/O In: 240 [P.O.:240] Out: 600 [Urine:600]  Lab Results: Recent Labs    10/14/20 0232 10/15/20 0306  WBC 6.2 6.3  HGB 12.2 12.4  HCT 38.2 39.1  PLT 113* 111*   BMET:  Recent Labs    10/14/20 0232 10/15/20 0306  NA 139 141  K 3.5 3.9  CL 110 110  CO2 22 24  GLUCOSE 110* 90  BUN 12 10  CREATININE 0.70 0.84  CALCIUM 8.9 9.1    CBG (last 3)  No results for input(s): GLUCAP in the last 72 hours. PT/INR:   Recent Labs    10/14/20 0232  LABPROT 13.5  INR 1.0    CXR:  CHEST - 2 VIEW  COMPARISON:  10/12/2020  FINDINGS: Normal heart size, mediastinal contours, and pulmonary vascularity.  Lungs clear.  No acute infiltrate, pleural effusion or pneumothorax.  Biconvex thoracic scoliosis.  Slight osseous demineralization.  IMPRESSION: No acute abnormalities.   Electronically Signed   By: Ulyses Southward M.D.   On: 10/15/2020 10:32   Assessment/Plan: S/P Procedure(s) (LRB): LEFT HEART CATH AND CORONARY ANGIOGRAPHY  (N/A) INTRAVASCULAR PRESSURE WIRE/FFR STUDY (N/A)  I have again reviewed the indications, risks, and potential benefits of coronary artery bypass grafting with the patient at the bedside this afternoon.  Alternative treatment strategies have been discussed, including the relative risks, benefits and long term prognosis associated with medical therapy, percutaneous coronary intervention, and surgical revascularization.  The patient understands and accepts all potential associated risks of surgery including but not limited to risk of death, stroke or other neurologic complication, myocardial infarction, congestive heart failure, respiratory failure, renal failure, bleeding requiring blood transfusion and/or reexploration, aortic dissection or other major vascular complication, arrhythmia, heart block or bradycardia requiring permanent pacemaker, pneumonia, pleural effusion, wound infection, pulmonary embolus or other thromboembolic complication, chronic pain or other delayed complications related to median sternotomy, or the late recurrence of symptomatic ischemic heart disease and/or congestive heart failure.  The importance of long term risk modification have been emphasized including tobacco cessation.  All questions answered.  For OR tomorrow morning.   I spent in excess of 15 minutes during the conduct of this hospital encounter and >50% of this time involved direct face-to-face encounter with the patient for counseling and/or coordination of their care.    Purcell Nails, MD 10/15/2020 1:23 PM

## 2020-10-15 NOTE — Progress Notes (Signed)
ANTICOAGULATION CONSULT NOTE Pharmacy Consult for heparin Indication: chest pain/ACS, awaiting CABG  Allergies  Allergen Reactions  . Codeine Anaphylaxis    Patient Measurements: Height: 5\' 3"  (160 cm) Weight: 65.3 kg (144 lb) IBW/kg (Calculated) : 52.4 Heparin Dosing Weight: 65.9 kg  Vital Signs: Temp: 97.9 F (36.6 C) (04/14 1157) Temp Source: Oral (04/14 1157) BP: 118/73 (04/14 1200) Pulse Rate: 63 (04/14 1157)  Labs: Recent Labs    10/12/20 1631 10/12/20 1737 10/12/20 1928 10/12/20 2212 10/13/20 0145 10/14/20 0232 10/14/20 0324 10/14/20 1233 10/15/20 0306  HGB  --   --   --   --  13.0 12.2  --   --  12.4  HCT  --   --   --   --  40.7 38.2  --   --  39.1  PLT  --   --   --   --  130* 113*  --   --  111*  APTT  --   --   --   --   --  72*  --   --   --   LABPROT  --   --   --   --   --  13.5  --   --   --   INR  --   --   --   --   --  1.0  --   --   --   HEPARINUNFRC   < >  --   --   --  0.15*  --  0.23* 0.29* 0.54  CREATININE  --   --   --   --  0.66 0.70  --   --  0.84  TROPONINIHS  --  3,345* 7,75504/16/22*  --   --   --   --   --    < > = values in this interval not displayed.    Estimated Creatinine Clearance: 64.8 mL/min (by C-G formula based on SCr of 0.84 mg/dL).  Assessment: 60 y.o. female with NSTEMI s/p cath for CABG evaluation. Heparin restarted post cath 4/12 and plans for CABG on 4/15 -heparin level at goal  Goal of Therapy:  Heparin level 0.3-0.7 units/ml Monitor platelets by anticoagulation protocol: Yes   Plan:  -Continue heparin1250 units/hr -Daily heparin level and CBC -CABG 4/15  5/15, PharmD Clinical Pharmacist **Pharmacist phone directory can now be found on [email or web address] ([email or web address]).  Listed under [facility].

## 2020-10-15 NOTE — Progress Notes (Signed)
Pt has been walking without any problem. Checked in to ensure no questions. She is doing well, talkative.  [id number] Ethelda Chick CES, ACSM 1:18 PM 10/15/2020

## 2020-10-15 NOTE — Progress Notes (Addendum)
Progress Note  Patient Name: April Bryant Date of Encounter: 10/15/2020  [facility] Cardiologist: Christell Constant, MD   Subjective   Walking the halls this morning. No complaints of chest pain, SOB, palpitations, or LE edema. Asking about activity level following surgery. Also asking about management of her arthritis as she was on meloxicam prior to admission.   Inpatient Medications    Scheduled Meds:  aspirin  81 mg Oral Daily   atorvastatin  80 mg Oral Daily   carvedilol  25 mg Oral BID WC   [START ON 10/16/2020] epinephrine  0-10 mcg/min Intravenous To OR   [START ON 10/16/2020] heparin-papaverine-plasmalyte irrigation   Irrigation To OR   [START ON 10/16/2020] insulin   Intravenous To OR   isosorbide mononitrate  30 mg Oral Daily   losartan  25 mg Oral Daily   [START ON 10/16/2020] magnesium sulfate  40 mEq Other To OR   [START ON 10/16/2020] phenylephrine  30-200 mcg/min Intravenous To OR   [START ON 10/16/2020] potassium chloride  80 mEq Other To OR   sodium chloride flush  3 mL Intravenous Q12H   [START ON 10/16/2020] tranexamic acid  15 mg/kg Intravenous To OR   [START ON 10/16/2020] tranexamic acid  2 mg/kg Intracatheter To OR   [START ON 10/16/2020] vancomycin 1000 mg in NS (1000 ml) irrigation for Dr. Cornelius Moras case   Irrigation To OR   Continuous Infusions:  sodium chloride     sodium chloride Stopped (10/13/20 1539)   [START ON 10/16/2020] cefUROXime (ZINACEF)  IV     [START ON 10/16/2020] cefUROXime (ZINACEF)  IV     [START ON 10/16/2020] dexmedetomidine     [START ON 10/16/2020] heparin 30,000 units/NS 1000 mL solution for CELLSAVER     heparin 1,250 Units/hr (10/14/20 1756)   [START ON 10/16/2020] milrinone     nitroGLYCERIN Stopped (10/13/20 1943)   [START ON 10/16/2020] nitroGLYCERIN     [START ON 10/16/2020] norepinephrine     [START ON 10/16/2020] tranexamic acid (CYKLOKAPRON) infusion ([facility])     [START ON 10/16/2020] vancomycin     PRN Meds: sodium  chloride, acetaminophen, nitroGLYCERIN, ondansetron (ZOFRAN) IV, sodium chloride flush   Vital Signs    Vitals:   10/14/20 1518 10/14/20 1811 10/14/20 1931 10/15/20 0404  BP: (!) 135/103 (!) 181/100 (!) 155/89 (!) 149/89  Pulse: 75 68    Resp: 16  16   Temp: 98.3 F (36.8 C)  98.2 F (36.8 C) 98.2 F (36.8 C)  TempSrc: Oral  Oral Oral  SpO2: 96%     Weight:    65.3 kg  Height:        Intake/Output Summary (Last 24 hours) at 10/15/2020 0816 Last data filed at 10/15/2020 0411 Gross per 24 hour  Intake 360 ml  Output 2100 ml  Net -1740 ml   Last 3 Weights 10/15/2020 10/14/2020 10/13/2020  Weight (lbs) 144 lb 142 lb 1.6 oz 144 lb  Weight (kg) 65.318 kg 64.456 kg 65.318 kg      Telemetry    Sinus rhythm - Personally Reviewed  ECG    No new tracings - Personally Reviewed  Physical Exam   GEN: No acute distress.   Neck: No JVD Cardiac: RRR, no murmurs, rubs, or gallops.  Respiratory: Clear to auscultation bilaterally. GI: Soft, nontender, non-distended  MS: No edema; No deformity. Neuro:  Nonfocal  Psych: Normal affect   Labs    High Sensitivity Troponin:   Recent Labs  Lab 10/12/20 1631 10/12/20 1737 10/12/20 1928 10/12/20 2212  TROPONINIHS 1,577* 3,345* 7,755* 9,893*      Chemistry Recent Labs  Lab 10/13/20 0145 10/14/20 0232 10/15/20 0306  NA 139 139 141  K 3.5 3.5 3.9  CL 108 110 110  CO2 GLUCOSE 108* 110* 90  BUN CREATININE 0.66 0.70 0.84  CALCIUM 9.0 8.9 9.1  PROT  --  5.8*  --   ALBUMIN  --  3.6  --   AST  --  37  --   ALT  --  21  --   ALKPHOS  --  60  --   BILITOT  --  0.7  --   GFRNONAA >60 >60 >60  ANIONGAP Hematology Recent Labs  Lab 10/13/20 0145 10/14/20 0232 10/15/20 0306  WBC 5.8 6.2 6.3  RBC 5.35* 5.05 5.12*  HGB 13.0 12.2 12.4  HCT 40.7 38.2 39.1  MCV 76.1* 75.6* 76.4*  MCH 24.3* 24.2* 24.2*  MCHC 31.9 31.9 31.7  RDW 14.9 15.0 14.9  PLT 130* 113* 111*    BNPNo results for  input(s): BNP, PROBNP in the last 168 hours.   DDimer No results for input(s): DDIMER in the last 168 hours.   Radiology    CARDIAC CATHETERIZATION  Result Date: 10/13/2020  Culprit lesion is a large branching ramus intermedius and has ostial to proximal narrowing without landing zone for PCI.  Left main is widely patent  LAD proximal to mid segment with intermediate stenosis in the 65 to 75% range documented to be hemodynamically significant by both RFR (0.82) and FFR (0.72).  Circumflex is relatively small.  There is a moderate-sized third obtuse marginal.  The mid vessel contains eccentric 50 to 60% narrowing.  RCA is dominant.  There is significant tortuosity.  The distal RCA before the PDA contains segmental 50% narrowing.  The ostial to proximal PDA is 60-70% narrowed.  Overall LV function is normal with EF 60%. RECOMMENDATIONS:  TCTS consultation.  There is three-vessel coronary artery disease in this patient who smokes, has significant hypertension, is prediabetic, and hyperlipidemia.  PCI on ramus intermedius would require overhang of stent into the distal left main.  ECHOCARDIOGRAM COMPLETE  Result Date: 10/13/2020    ECHOCARDIOGRAM REPORT   Patient Name:   April Bryant Date of Exam: 10/13/2020 Medical Rec #:  [id number]        Height:       63.0 in Accession #:    [id number]       Weight:       144.0 lb Date of Birth:  [birth year]        BSA:          1.682 m Patient Age:    60 years         BP:           182/94 mmHg Patient Gender: F                HR:           60 bpm. Exam Location:  Inpatient Procedure: 2D Echo, Cardiac Doppler and Color Doppler Indications:    NSTEMI  History:        Patient has no prior history of Echocardiogram examinations.                 Risk Factors:Hypertension.  Sonographer:    Neomia Dear RDCS Referring Phys: [id number]  [facility]  Sonographer Comments: 10/13/20 cath IMPRESSIONS  1. Left ventricular ejection fraction, by estimation, is 55 to 60%.  The left ventricle has normal function. The left ventricle has no regional wall motion abnormalities. Left ventricular diastolic parameters are consistent with Grade II diastolic dysfunction (pseudonormalization).  2. Right ventricular systolic function is normal. The right ventricular size is normal. There is moderately elevated pulmonary artery systolic pressure.  3. Left atrial size was mild to moderately dilated.  4. The mitral valve is normal in structure. Moderate mitral valve regurgitation. No evidence of mitral stenosis.  5. The aortic valve is normal in structure. Aortic valve regurgitation is not visualized. No aortic stenosis is present. FINDINGS  Left Ventricle: Left ventricular ejection fraction, by estimation, is 55 to 60%. The left ventricle has normal function. The left ventricle has no regional wall motion abnormalities. The left ventricular internal cavity size was normal in size. There is  no left ventricular hypertrophy. Left ventricular diastolic parameters are consistent with Grade II diastolic dysfunction (pseudonormalization). Right Ventricle: The right ventricular size is normal. No increase in right ventricular wall thickness. Right ventricular systolic function is normal. There is moderately elevated pulmonary artery systolic pressure. The tricuspid regurgitant velocity is 3.76 m/s, and with an assumed right atrial pressure of 3 mmHg, the estimated right ventricular systolic pressure is 59.6 mmHg. Left Atrium: Left atrial size was mild to moderately dilated. Right Atrium: Right atrial size was normal in size. Pericardium: There is no evidence of pericardial effusion. Mitral Valve: The mitral valve is normal in structure. Moderate mitral valve regurgitation. No evidence of mitral valve stenosis. Tricuspid Valve: The tricuspid valve is normal in structure. Tricuspid valve regurgitation is not demonstrated. No evidence of tricuspid stenosis. Aortic Valve: The aortic valve is normal in  structure. Aortic valve regurgitation is not visualized. Aortic regurgitation PHT measures 730 msec. No aortic stenosis is present. Aortic valve mean gradient measures 5.0 mmHg. Aortic valve peak gradient measures 8.9 mmHg. Aortic valve area, by VTI measures 1.42 cm. Pulmonic Valve: The pulmonic valve was normal in structure. Pulmonic valve regurgitation is not visualized. Aorta: The aortic root and ascending aorta are structurally normal, with no evidence of dilitation. IAS/Shunts: The atrial septum is grossly normal.  LEFT VENTRICLE PLAX 2D LVIDd:         4.60 cm     Diastology LVIDs:         2.80 cm     LV e' medial:    5.87 cm/s LV PW:         1.10 cm     LV E/e' medial:  15.8 LV IVS:        1.40 cm     LV e' lateral:   4.57 cm/s LVOT diam:     2.00 cm     LV E/e' lateral: 20.3 LV SV:         47 LV SV Index:   28 LVOT Area:     3.14 cm  LV Volumes (MOD) LV vol d, MOD A2C: 68.9 ml LV vol d, MOD A4C: 83.3 ml LV vol s, MOD A2C: 41.1 ml LV vol s, MOD A4C: 53.5 ml LV SV MOD A2C:     27.8 ml LV SV MOD A4C:     83.3 ml LV SV MOD BP:      27.4 ml  PULMONARY VEINS A Reversal Duration: 108.00 msec A Reversal Velocity: 26.20 cm/s Diastolic Velocity:  49.40 cm/s S/D Velocity:  1.20 Systolic Velocity:   60.80 cm/s LEFT ATRIUM             Index       RIGHT ATRIUM          Index LA diam:        4.00 cm 2.38 cm/m  RA Area:     8.92 cm LA Vol (A2C):   49.1 ml 29.20 ml/m RA Volume:   18.50 ml 11.00 ml/m LA Vol (A4C):   92.7 ml 55.12 ml/m LA Biplane Vol: 68.1 ml 40.50 ml/m  AORTIC VALVE                    PULMONIC VALVE AV Area (Vmax):    1.58 cm     PV Vmax:       0.95 m/s AV Area (Vmean):   1.58 cm     PV Vmean:      72.700 cm/s AV Area (VTI):     1.42 cm     PV VTI:        0.222 m AV Vmax:           149.00 cm/s  PV Peak grad:  3.6 mmHg AV Vmean:          103.400 cm/s PV Mean grad:  2.0 mmHg AV VTI:            0.332 m AV Peak Grad:      8.9 mmHg AV Mean Grad:      5.0 mmHg LVOT Vmax:         74.75 cm/s LVOT  Vmean:        52.100 cm/s LVOT VTI:          0.150 m LVOT/AV VTI ratio: 0.45 AI PHT:            730 msec  AORTA Ao Root diam: 3.30 cm Ao Asc diam:  2.90 cm MITRAL VALVE                 TRICUSPID VALVE MV Area (PHT): 3.50 cm      TR Peak grad:   56.6 mmHg MV Decel Time: 217 msec      TR Vmax:        376.00 cm/s MR Peak grad:    124.3 mmHg MR Mean grad:    84.5 mmHg   SHUNTS MR Vmax:         557.50 cm/s Systemic VTI:  0.15 m MR Vmean:        441.0 cm/s  Systemic Diam: 2.00 cm MR PISA:         0.77 cm MR PISA Eff ROA: 3 mm MR PISA Radius:  0.35 cm MV E velocity: 92.70 cm/s MV A velocity: 112.00 cm/s MV E/A ratio:  0.83 Kristeen Miss MD Electronically signed by Kristeen Miss MD Signature Date/Time: 10/13/2020/11:52:15 AM    Final    VAS US DOPPLER PRE CABG  Result Date: 10/14/2020 PREOPERATIVE VASCULAR EVALUATION  Indications:      Pre-CABG. Risk Factors:     Hypertension, hyperlipidemia, current smoker. Limitations:      High bifurcation of carotid arteries. Comparison Study: No prior studies. Performing Technologist: Jean Rosenthal RDMS RVT  Examination Guidelines: A complete evaluation includes B-mode imaging, spectral Doppler, color Doppler, and power Doppler as needed of all accessible portions of each vessel. Bilateral testing is considered an integral part of a complete examination. Limited examinations for reoccurring indications may be performed as noted.  Right Carotid Findings: +----------+--------+--------+--------+------------+--------+  PSV cm/sEDV cm/sStenosisDescribe    Comments +----------+--------+--------+--------+------------+--------+ CCA Prox  94      11                                   +----------+--------+--------+--------+------------+--------+ CCA Distal105     22                                   +----------+--------+--------+--------+------------+--------+ ICA Prox  65      17      1-39%   heterogenous          +----------+--------+--------+--------+------------+--------+ ICA Distal80      17                          tortuous +----------+--------+--------+--------+------------+--------+ ECA       88                                           +----------+--------+--------+--------+------------+--------+ Portions of this table do not appear on this page. +----------+--------+-------+----------------+------------+           PSV cm/sEDV cmsDescribe        Arm Pressure +----------+--------+-------+----------------+------------+ Subclavian178            Multiphasic, WNL             +----------+--------+-------+----------------+------------+ +---------+--------+--+--------+--+---------+ VertebralPSV cm/s51EDV cm/s16Antegrade +---------+--------+--+--------+--+---------+ Left Carotid Findings: +----------+--------+--------+--------+--------+--------+           PSV cm/sEDV cm/sStenosisDescribeComments +----------+--------+--------+--------+--------+--------+ CCA Prox  140     27                               +----------+--------+--------+--------+--------+--------+ CCA Distal82      20                               +----------+--------+--------+--------+--------+--------+ ICA Prox  70      27      1-39%                    +----------+--------+--------+--------+--------+--------+ ICA Distal71      15                      tortuous +----------+--------+--------+--------+--------+--------+ ECA       94      11                               +----------+--------+--------+--------+--------+--------+ +----------+--------+--------+----------------+------------+ SubclavianPSV cm/sEDV cm/sDescribe        Arm Pressure +----------+--------+--------+----------------+------------+           125             Multiphasic, WNL             +----------+--------+--------+----------------+------------+ +---------+--------+--+--------+--+---------+ VertebralPSV  cm/s80EDV cm/s16Antegrade +---------+--------+--+--------+--+---------+  ABI Findings: +--------+------------------+-----+---------+--------+ Right   Rt Pressure (mmHg)IndexWaveform Comment  +--------+------------------+-----+---------+--------+ Brachial170                    triphasic         +--------+------------------+-----+---------+--------+ PTA     199  1.17 triphasic         +--------+------------------+-----+---------+--------+ DP      196               1.15 triphasic         +--------+------------------+-----+---------+--------+ +--------+------------------+-----+---------+-------+ Left    Lt Pressure (mmHg)IndexWaveform Comment +--------+------------------+-----+---------+-----[id number]                    triphasic        +--------+------------------+-----+---------+-------+ PTA     184               1.08 triphasic        +--------+------------------+-----+---------+-------+ DP      183               1.08 triphasic        +--------+------------------+-----+---------+-------+  Right Doppler Findings: +--------+--------+-----+---------+--------+ Site    PressureIndexDoppler  Comments +--------+--------+-----+---------+--------+ Brachial170          triphasic         +--------+--------+-----+---------+--------+ Radial               triphasic         +--------+--------+-----+---------+--------+ Ulnar                triphasic         +--------+--------+-----+---------+--------+  Left Doppler Findings: +--------+--------+-----+---------+--------+ Site    PressureIndexDoppler  Comments +--------+--------+-----+---------+--------+ [id number]          triphasic         +--------+--------+-----+---------+--------+ Radial               triphasic         +--------+--------+-----+---------+--------+ Ulnar                triphasic         +--------+--------+-----+---------+--------+  Summary: Right  Carotid: Velocities in the right ICA are consistent with a 1-39% stenosis. Left Carotid: Velocities in the left ICA are consistent with a 1-39% stenosis. Vertebrals:  Bilateral vertebral arteries demonstrate antegrade flow. Subclavians: Normal flow hemodynamics were seen in bilateral subclavian              arteries. Right ABI: Resting right ankle-brachial index is within normal range. No evidence of significant right lower extremity arterial disease. Left ABI: Resting left ankle-brachial index is within normal range. No evidence of significant left lower extremity arterial disease. Right Upper Extremity: Doppler waveforms decrease >50% with right radial compression. Doppler waveforms remain within normal limits with right ulnar compression. Left Upper Extremity: Doppler waveforms remain within normal limits with left radial compression. Doppler waveforms remain within normal limits with left ulnar compression.  Electronically signed by Sherald Hess MD on 10/14/2020 at 5:44:46 PM.    Final     Cardiac Studies     LHC 10/13/20: Culprit lesion is a large branching ramus intermedius and has ostial to proximal narrowing without landing zone for PCI. Left main is widely patent LAD proximal to mid segment with intermediate stenosis in the 65 to 75% range documented to be hemodynamically significant by both RFR (0.82) and FFR (0.72). Circumflex is relatively small.  There is a moderate-sized third obtuse marginal.  The mid vessel contains eccentric 50 to 60% narrowing. RCA is dominant.  There is significant tortuosity.  The distal RCA before the PDA contains segmental 50% narrowing.  The ostial to proximal PDA is 60-70% narrowed. Overall LV function is normal with EF 60%.   RECOMMENDATIONS:   TCTS consultation. There is three-vessel coronary  artery disease in this patient who smokes, has significant hypertension, is prediabetic, and hyperlipidemia. PCI on ramus intermedius would require overhang of  stent into the distal left main.       Echocardiogram 10/13/20:  1. Left ventricular ejection fraction, by estimation, is 55 to 60%. The  left ventricle has normal function. The left ventricle has no regional  wall motion abnormalities. Left ventricular diastolic parameters are  consistent with Grade II diastolic  dysfunction (pseudonormalization).   2. Right ventricular systolic function is normal. The right ventricular  size is normal. There is moderately elevated pulmonary artery systolic  pressure.   3. Left atrial size was mild to moderately dilated.   4. The mitral valve is normal in structure. Moderate mitral valve  regurgitation. No evidence of mitral stenosis.   5. The aortic valve is normal in structure. Aortic valve regurgitation is  not visualized. No aortic stenosis is present.   Patient Profile   60 y.o. female with a PMH of HTN, RA, and tobacco abuse, who is being followed by cardiology for NSTEMI.  Assessment & Plan    1. NSTEMI in patient without known CAD: patient presented with throat tightness, found to have elevated HsTrop which peaked at 9893. EKG with submm STD in inferolateral leads. She was started on a heparin gtt given c/f ACS. She underwent LHC 10/13/20 which showed multivessel disease. Echocardiogram EF 55-60% and with no WMA. CT Surgery evaluated the patient and recommended CABG, tentatively planned for 10/16/20. - Continue heparin gtt - Continue aspirin - Continue statin - Continue BBlocker - planned to see CT surgery   2. Hypertensive urgency: BP improved but still above goal despite increasing carvedilol to 25mg  BID, starting losartan 25mg  daily, and continuing imdur 30mg  daily.  - Will continue carvedilol - Will increase losartan to 50mg  daily for improved BP control given normal Cr. - Continue imdur - Uptitrate above as needed   3. HLD: LDL 134 this admission. Started on atorvastatin 80mg  daily given c/f ACS - Continue atorvastatin - Will need  repeat FLP/LFTs in 6-8 weeks   4. Mitral regurgitation: noted to be moderate on echo this admission, though per Dr. Orvan July review he felt more likely mild.  - Continue routine monitoring outpatient    5. Pre-DM type 2: A1C 5.9 this admission - Continue lifestyle/dietary modifications to prevent progression.    6. Tobacco abuse: smoking 1/2 ppd for 30+ years - continue to encourage cessation.   7. Thrombocytopenia: PLT 111 today, overall stable this admission but a bit on the low side.  - Continue to monitor closely  For questions or updates, please contact [facility] Please consult [email or web address] for contact info under        Signed, Beatriz Stallion, PA-C  10/15/2020, 8:16 AM    Personally seen and examined. Agree with APP above with the following comments: Briefly 60 yo F with HTN, RA Tobacco Abuse, who presented with NSTEMI and multi-vessel disease Patient notes that she is nervous; has been hindered in walking by busy hallway Exam notable for persistently elevated BP; exam otherwise benign Labs notable for stable Hgb Personally reviewed relevant tests ST depression prominent on telemetry Would recommend  - increase in losartan as above - tylenol for pain; can consider outpatient NSAID restart; discussed this with patient - needs longer batter for IV pole - asking if we have additional battery  Planned for surgery 10/16/20; NPO at midnight  Riley Lam, MD Cardiologist [facility]  [facility]  [phone]  [address], [address] [place], [place] [address] [phone]  11:08 AM

## 2020-10-16 ENCOUNTER — Encounter (HOSPITAL_COMMUNITY): Payer: Self-pay

## 2020-10-16 ENCOUNTER — Inpatient Hospital Stay (HOSPITAL_COMMUNITY): Payer: 59

## 2020-10-16 ENCOUNTER — Inpatient Hospital Stay (HOSPITAL_COMMUNITY): Payer: 59 | Admitting: Registered Nurse

## 2020-10-16 ENCOUNTER — Inpatient Hospital Stay (HOSPITAL_COMMUNITY)
Admission: EM | Disposition: A | Discharge status: Home / Self Care | Payer: Self-pay | Source: Home / Self Care | Attending: Thoracic Surgery (Cardiothoracic Vascular Surgery)

## 2020-10-16 DIAGNOSIS — I2510 Atherosclerotic heart disease of native coronary artery without angina pectoris: Secondary | ICD-10-CM

## 2020-10-16 DIAGNOSIS — Z951 Presence of aortocoronary bypass graft: Secondary | ICD-10-CM

## 2020-10-16 HISTORY — PX: TEE WITHOUT CARDIOVERSION: SHX5443

## 2020-10-16 HISTORY — PX: CORONARY ARTERY BYPASS GRAFT: SHX141

## 2020-10-16 HISTORY — DX: Presence of aortocoronary bypass graft: Z95.1

## 2020-10-16 LAB — POCT I-STAT, CHEM 8
BUN: 14 mg/dL (ref 6–20)
BUN: 14 mg/dL (ref 6–20)
BUN: 12 mg/dL (ref 6–20)
BUN: 13 mg/dL (ref 6–20)
BUN: 12 mg/dL (ref 6–20)
Calcium, Ion: 1.29 mmol/L (ref 1.15–1.40)
Calcium, Ion: 1.3 mmol/L (ref 1.15–1.40)
Calcium, Ion: 1.12 mmol/L — ABNORMAL LOW (ref 1.15–1.40)
Calcium, Ion: 1.16 mmol/L (ref 1.15–1.40)
Calcium, Ion: 1.1 mmol/L — ABNORMAL LOW (ref 1.15–1.40)
Chloride: 109 mmol/L (ref 98–111)
Chloride: 109 mmol/L (ref 98–111)
Chloride: 106 mmol/L (ref 98–111)
Chloride: 107 mmol/L (ref 98–111)
Chloride: 107 mmol/L (ref 98–111)
Creatinine, Ser: 0.6 mg/dL (ref 0.44–1.00)
Creatinine, Ser: 0.8 mg/dL (ref 0.44–1.00)
Creatinine, Ser: 0.6 mg/dL (ref 0.44–1.00)
Creatinine, Ser: 0.6 mg/dL (ref 0.44–1.00)
Creatinine, Ser: 0.7 mg/dL (ref 0.44–1.00)
Glucose, Bld: 99 mg/dL (ref 70–99)
Glucose, Bld: 129 mg/dL — ABNORMAL HIGH (ref 70–99)
Glucose, Bld: 149 mg/dL — ABNORMAL HIGH (ref 70–99)
Glucose, Bld: 155 mg/dL — ABNORMAL HIGH (ref 70–99)
Glucose, Bld: 126 mg/dL — ABNORMAL HIGH (ref 70–99)
HCT: 37 % (ref 36.0–46.0)
HCT: 35 % — ABNORMAL LOW (ref 36.0–46.0)
HCT: 27 % — ABNORMAL LOW (ref 36.0–46.0)
HCT: 29 % — ABNORMAL LOW (ref 36.0–46.0)
HCT: 23 % — ABNORMAL LOW (ref 36.0–46.0)
Hemoglobin: 12.6 g/dL (ref 12.0–15.0)
Hemoglobin: 11.9 g/dL — ABNORMAL LOW (ref 12.0–15.0)
Hemoglobin: 9.2 g/dL — ABNORMAL LOW (ref 12.0–15.0)
Hemoglobin: 9.9 g/dL — ABNORMAL LOW (ref 12.0–15.0)
Hemoglobin: 7.8 g/dL — ABNORMAL LOW (ref 12.0–15.0)
Potassium: 4.1 mmol/L (ref 3.5–5.1)
Potassium: 4.2 mmol/L (ref 3.5–5.1)
Potassium: 4.9 mmol/L (ref 3.5–5.1)
Potassium: 5.3 mmol/L — ABNORMAL HIGH (ref 3.5–5.1)
Potassium: 4.4 mmol/L (ref 3.5–5.1)
Sodium: 144 mmol/L (ref 135–145)
Sodium: 142 mmol/L (ref 135–145)
Sodium: 141 mmol/L (ref 135–145)
Sodium: 142 mmol/L (ref 135–145)
Sodium: 142 mmol/L (ref 135–145)
TCO2: 24 mmol/L (ref 22–32)
TCO2: 24 mmol/L (ref 22–32)
TCO2: 23 mmol/L (ref 22–32)
TCO2: 24 mmol/L (ref 22–32)
TCO2: 23 mmol/L (ref 22–32)

## 2020-10-16 LAB — POCT I-STAT 7, (LYTES, BLD GAS, ICA,H+H)
Acid-Base Excess: 0 mmol/L (ref 0.0–2.0)
Acid-Base Excess: 1 mmol/L (ref 0.0–2.0)
Acid-Base Excess: 1 mmol/L (ref 0.0–2.0)
Acid-base deficit: 4 mmol/L — ABNORMAL HIGH (ref 0.0–2.0)
Acid-base deficit: 2 mmol/L (ref 0.0–2.0)
Acid-base deficit: 2 mmol/L (ref 0.0–2.0)
Acid-base deficit: 5 mmol/L — ABNORMAL HIGH (ref 0.0–2.0)
Acid-base deficit: 5 mmol/L — ABNORMAL HIGH (ref 0.0–2.0)
Acid-base deficit: 2 mmol/L (ref 0.0–2.0)
Bicarbonate: 22.1 mmol/L (ref 20.0–28.0)
Bicarbonate: 25.9 mmol/L (ref 20.0–28.0)
Bicarbonate: 24.1 mmol/L (ref 20.0–28.0)
Bicarbonate: 25.6 mmol/L (ref 20.0–28.0)
Bicarbonate: 22.6 mmol/L (ref 20.0–28.0)
Bicarbonate: 26.7 mmol/L (ref 20.0–28.0)
Bicarbonate: 22.9 mmol/L (ref 20.0–28.0)
Bicarbonate: 21 mmol/L (ref 20.0–28.0)
Bicarbonate: 23.1 mmol/L (ref 20.0–28.0)
Calcium, Ion: 1.28 mmol/L (ref 1.15–1.40)
Calcium, Ion: 0.98 mmol/L — ABNORMAL LOW (ref 1.15–1.40)
Calcium, Ion: 1.13 mmol/L — ABNORMAL LOW (ref 1.15–1.40)
Calcium, Ion: 1.14 mmol/L — ABNORMAL LOW (ref 1.15–1.40)
Calcium, Ion: 1.07 mmol/L — ABNORMAL LOW (ref 1.15–1.40)
Calcium, Ion: 1.12 mmol/L — ABNORMAL LOW (ref 1.15–1.40)
Calcium, Ion: 1.19 mmol/L (ref 1.15–1.40)
Calcium, Ion: 1.11 mmol/L — ABNORMAL LOW (ref 1.15–1.40)
Calcium, Ion: 1.02 mmol/L — ABNORMAL LOW (ref 1.15–1.40)
HCT: 37 % (ref 36.0–46.0)
HCT: 26 % — ABNORMAL LOW (ref 36.0–46.0)
HCT: 25 % — ABNORMAL LOW (ref 36.0–46.0)
HCT: 27 % — ABNORMAL LOW (ref 36.0–46.0)
HCT: 24 % — ABNORMAL LOW (ref 36.0–46.0)
HCT: 26 % — ABNORMAL LOW (ref 36.0–46.0)
HCT: 29 % — ABNORMAL LOW (ref 36.0–46.0)
HCT: 28 % — ABNORMAL LOW (ref 36.0–46.0)
HCT: 26 % — ABNORMAL LOW (ref 36.0–46.0)
Hemoglobin: 12.6 g/dL (ref 12.0–15.0)
Hemoglobin: 8.8 g/dL — ABNORMAL LOW (ref 12.0–15.0)
Hemoglobin: 8.5 g/dL — ABNORMAL LOW (ref 12.0–15.0)
Hemoglobin: 9.2 g/dL — ABNORMAL LOW (ref 12.0–15.0)
Hemoglobin: 8.2 g/dL — ABNORMAL LOW (ref 12.0–15.0)
Hemoglobin: 8.8 g/dL — ABNORMAL LOW (ref 12.0–15.0)
Hemoglobin: 9.9 g/dL — ABNORMAL LOW (ref 12.0–15.0)
Hemoglobin: 9.5 g/dL — ABNORMAL LOW (ref 12.0–15.0)
Hemoglobin: 8.8 g/dL — ABNORMAL LOW (ref 12.0–15.0)
O2 Saturation: 100 %
O2 Saturation: 100 %
O2 Saturation: 100 %
O2 Saturation: 100 %
O2 Saturation: 100 %
O2 Saturation: 100 %
O2 Saturation: 94 %
O2 Saturation: 94 %
O2 Saturation: 93 %
Patient temperature: 35.8
Patient temperature: 36.3
Patient temperature: 36.9
Potassium: 4 mmol/L (ref 3.5–5.1)
Potassium: 4.6 mmol/L (ref 3.5–5.1)
Potassium: 5.3 mmol/L — ABNORMAL HIGH (ref 3.5–5.1)
Potassium: 5.6 mmol/L — ABNORMAL HIGH (ref 3.5–5.1)
Potassium: 4.4 mmol/L (ref 3.5–5.1)
Potassium: 5 mmol/L (ref 3.5–5.1)
Potassium: 4.1 mmol/L (ref 3.5–5.1)
Potassium: 3.7 mmol/L (ref 3.5–5.1)
Potassium: 3.3 mmol/L — ABNORMAL LOW (ref 3.5–5.1)
Sodium: 143 mmol/L (ref 135–145)
Sodium: 143 mmol/L (ref 135–145)
Sodium: 141 mmol/L (ref 135–145)
Sodium: 141 mmol/L (ref 135–145)
Sodium: 142 mmol/L (ref 135–145)
Sodium: 142 mmol/L (ref 135–145)
Sodium: 143 mmol/L (ref 135–145)
Sodium: 145 mmol/L (ref 135–145)
Sodium: 148 mmol/L — ABNORMAL HIGH (ref 135–145)
TCO2: 23 mmol/L (ref 22–32)
TCO2: 27 mmol/L (ref 22–32)
TCO2: 26 mmol/L (ref 22–32)
TCO2: 27 mmol/L (ref 22–32)
TCO2: 24 mmol/L (ref 22–32)
TCO2: 28 mmol/L (ref 22–32)
TCO2: 24 mmol/L (ref 22–32)
TCO2: 22 mmol/L (ref 22–32)
TCO2: 24 mmol/L (ref 22–32)
pCO2 arterial: 43 mmHg (ref 32.0–48.0)
pCO2 arterial: 48.5 mmHg — ABNORMAL HIGH (ref 32.0–48.0)
pCO2 arterial: 41.4 mmHg (ref 32.0–48.0)
pCO2 arterial: 46.4 mmHg (ref 32.0–48.0)
pCO2 arterial: 35.9 mmHg (ref 32.0–48.0)
pCO2 arterial: 47.5 mmHg (ref 32.0–48.0)
pCO2 arterial: 50.2 mmHg — ABNORMAL HIGH (ref 32.0–48.0)
pCO2 arterial: 41.2 mmHg (ref 32.0–48.0)
pCO2 arterial: 40 mmHg (ref 32.0–48.0)
pH, Arterial: 7.32 — ABNORMAL LOW (ref 7.350–7.450)
pH, Arterial: 7.334 — ABNORMAL LOW (ref 7.350–7.450)
pH, Arterial: 7.324 — ABNORMAL LOW (ref 7.350–7.450)
pH, Arterial: 7.4 (ref 7.350–7.450)
pH, Arterial: 7.358 (ref 7.350–7.450)
pH, Arterial: 7.408 (ref 7.350–7.450)
pH, Arterial: 7.26 — ABNORMAL LOW (ref 7.350–7.450)
pH, Arterial: 7.312 — ABNORMAL LOW (ref 7.350–7.450)
pH, Arterial: 7.369 (ref 7.350–7.450)
pO2, Arterial: 373 mmHg — ABNORMAL HIGH (ref 83.0–108.0)
pO2, Arterial: 408 mmHg — ABNORMAL HIGH (ref 83.0–108.0)
pO2, Arterial: 239 mmHg — ABNORMAL HIGH (ref 83.0–108.0)
pO2, Arterial: 251 mmHg — ABNORMAL HIGH (ref 83.0–108.0)
pO2, Arterial: 195 mmHg — ABNORMAL HIGH (ref 83.0–108.0)
pO2, Arterial: 401 mmHg — ABNORMAL HIGH (ref 83.0–108.0)
pO2, Arterial: 79 mmHg — ABNORMAL LOW (ref 83.0–108.0)
pO2, Arterial: 76 mmHg — ABNORMAL LOW (ref 83.0–108.0)
pO2, Arterial: 70 mmHg — ABNORMAL LOW (ref 83.0–108.0)

## 2020-10-16 LAB — CBC
HCT: 39.2 % (ref 36.0–46.0)
HCT: 29 % — ABNORMAL LOW (ref 36.0–46.0)
HCT: 27.9 % — ABNORMAL LOW (ref 36.0–46.0)
Hemoglobin: 12.4 g/dL (ref 12.0–15.0)
Hemoglobin: 9.1 g/dL — ABNORMAL LOW (ref 12.0–15.0)
Hemoglobin: 9 g/dL — ABNORMAL LOW (ref 12.0–15.0)
MCH: 24 pg — ABNORMAL LOW (ref 26.0–34.0)
MCH: 24.7 pg — ABNORMAL LOW (ref 26.0–34.0)
MCH: 24.9 pg — ABNORMAL LOW (ref 26.0–34.0)
MCHC: 31.6 g/dL (ref 30.0–36.0)
MCHC: 31.4 g/dL (ref 30.0–36.0)
MCHC: 32.3 g/dL (ref 30.0–36.0)
MCV: 76 fL — ABNORMAL LOW (ref 80.0–100.0)
MCV: 78.6 fL — ABNORMAL LOW (ref 80.0–100.0)
MCV: 77.3 fL — ABNORMAL LOW (ref 80.0–100.0)
Platelets: 105 10*3/uL — ABNORMAL LOW (ref 150–400)
Platelets: 74 10*3/uL — ABNORMAL LOW (ref 150–400)
Platelets: 111 10*3/uL — ABNORMAL LOW (ref 150–400)
RBC: 5.16 MIL/uL — ABNORMAL HIGH (ref 3.87–5.11)
RBC: 3.69 MIL/uL — ABNORMAL LOW (ref 3.87–5.11)
RBC: 3.61 MIL/uL — ABNORMAL LOW (ref 3.87–5.11)
RDW: 14.8 % (ref 11.5–15.5)
RDW: 14.9 % (ref 11.5–15.5)
RDW: 14.9 % (ref 11.5–15.5)
WBC: 6.6 10*3/uL (ref 4.0–10.5)
WBC: 13.7 10*3/uL — ABNORMAL HIGH (ref 4.0–10.5)
WBC: 14.7 10*3/uL — ABNORMAL HIGH (ref 4.0–10.5)
nRBC: 0 % (ref 0.0–0.2)
nRBC: 0 % (ref 0.0–0.2)
nRBC: 0 % (ref 0.0–0.2)

## 2020-10-16 LAB — POCT I-STAT EG7
Acid-base deficit: 3 mmol/L — ABNORMAL HIGH (ref 0.0–2.0)
Bicarbonate: 23.5 mmol/L (ref 20.0–28.0)
Calcium, Ion: 1.04 mmol/L — ABNORMAL LOW (ref 1.15–1.40)
HCT: 27 % — ABNORMAL LOW (ref 36.0–46.0)
Hemoglobin: 9.2 g/dL — ABNORMAL LOW (ref 12.0–15.0)
O2 Saturation: 86 %
Potassium: 4.5 mmol/L (ref 3.5–5.1)
Sodium: 143 mmol/L (ref 135–145)
TCO2: 25 mmol/L (ref 22–32)
pCO2, Ven: 46.2 mmHg (ref 44.0–60.0)
pH, Ven: 7.315 (ref 7.250–7.430)
pO2, Ven: 57 mmHg — ABNORMAL HIGH (ref 32.0–45.0)

## 2020-10-16 LAB — MAGNESIUM: Magnesium: 2.9 mg/dL — ABNORMAL HIGH (ref 1.7–2.4)

## 2020-10-16 LAB — ECHO INTRAOPERATIVE TEE
Height: 63 in
Weight: 2284.8 oz

## 2020-10-16 LAB — BASIC METABOLIC PANEL
Anion gap: 9 (ref 5–15)
Anion gap: 4 — ABNORMAL LOW (ref 5–15)
BUN: 15 mg/dL (ref 6–20)
BUN: 12 mg/dL (ref 6–20)
CO2: 22 mmol/L (ref 22–32)
CO2: 26 mmol/L (ref 22–32)
Calcium: 9.2 mg/dL (ref 8.9–10.3)
Calcium: 7.4 mg/dL — ABNORMAL LOW (ref 8.9–10.3)
Chloride: 109 mmol/L (ref 98–111)
Chloride: 112 mmol/L — ABNORMAL HIGH (ref 98–111)
Creatinine, Ser: 0.71 mg/dL (ref 0.44–1.00)
Creatinine, Ser: 0.64 mg/dL (ref 0.44–1.00)
GFR, Estimated: >60 mL/min (ref >60)
GFR, Estimated: >60 mL/min (ref >60)
Glucose, Bld: 111 mg/dL — ABNORMAL HIGH (ref 70–99)
Glucose, Bld: 146 mg/dL — ABNORMAL HIGH (ref 70–99)
Potassium: 4.2 mmol/L (ref 3.5–5.1)
Potassium: 3.6 mmol/L (ref 3.5–5.1)
Sodium: 140 mmol/L (ref 135–145)
Sodium: 142 mmol/L (ref 135–145)

## 2020-10-16 LAB — GLUCOSE, CAPILLARY
Glucose-Capillary: 145 mg/dL — ABNORMAL HIGH (ref 70–99)
Glucose-Capillary: 148 mg/dL — ABNORMAL HIGH (ref 70–99)
Glucose-Capillary: 136 mg/dL — ABNORMAL HIGH (ref 70–99)
Glucose-Capillary: 108 mg/dL — ABNORMAL HIGH (ref 70–99)
Glucose-Capillary: 163 mg/dL — ABNORMAL HIGH (ref 70–99)
Glucose-Capillary: 158 mg/dL — ABNORMAL HIGH (ref 70–99)
Glucose-Capillary: 178 mg/dL — ABNORMAL HIGH (ref 70–99)
Glucose-Capillary: 160 mg/dL — ABNORMAL HIGH (ref 70–99)

## 2020-10-16 LAB — PLATELET COUNT: Platelets: 98 10*3/uL — ABNORMAL LOW (ref 150–400)

## 2020-10-16 LAB — HEMOGLOBIN AND HEMATOCRIT, BLOOD
HCT: 25.3 % — ABNORMAL LOW (ref 36.0–46.0)
Hemoglobin: 8 g/dL — ABNORMAL LOW (ref 12.0–15.0)

## 2020-10-16 LAB — PROTIME-INR
INR: 1.4 — ABNORMAL HIGH (ref 0.8–1.2)
Prothrombin Time: 17.2 seconds — ABNORMAL HIGH (ref 11.4–15.2)

## 2020-10-16 LAB — APTT: aPTT: 32 seconds (ref 24–36)

## 2020-10-16 SURGERY — CORONARY ARTERY BYPASS GRAFTING (CABG)
Anesthesia: General | Site: Chest

## 2020-10-16 MED ORDER — LACTATED RINGERS IV SOLN: INTRAVENOUS | Status: DC | PRN

## 2020-10-16 MED ORDER — SODIUM BICARBONATE 8.4 % IV SOLN: 50.0000 meq | Freq: Once | INTRAVENOUS | Status: AC

## 2020-10-16 MED ORDER — DEXTROSE 50 % IV SOLN: 0.0000 mL | INTRAVENOUS | Status: DC | PRN

## 2020-10-16 MED ORDER — ALBUTEROL SULFATE (2.5 MG/3ML) 0.083% IN NEBU
INHALATION_SOLUTION | RESPIRATORY_TRACT | Status: AC
  Filled 2020-10-16: qty 3

## 2020-10-16 MED ORDER — PROTAMINE SULFATE 10 MG/ML IV SOLN
INTRAVENOUS | Status: DC | PRN
  Administered 2020-10-16: 230 mg via INTRAVENOUS

## 2020-10-16 MED ORDER — ACETAMINOPHEN 160 MG/5ML PO SOLN: 1000.0000 mg | Freq: Four times a day (QID) | ORAL | Status: DC

## 2020-10-16 MED ORDER — PROPOFOL 10 MG/ML IV BOLUS
INTRAVENOUS | Status: DC | PRN
  Administered 2020-10-16: 50 mg via INTRAVENOUS
  Administered 2020-10-16: 100 mg via INTRAVENOUS

## 2020-10-16 MED ORDER — PHENYLEPHRINE HCL-NACL 20-0.9 MG/250ML-% IV SOLN: 0.0000 ug/min | INTRAVENOUS | Status: DC

## 2020-10-16 MED ORDER — DOCUSATE SODIUM 100 MG PO CAPS
200.0000 mg | ORAL_CAPSULE | Freq: Every day | ORAL | Status: DC
  Administered 2020-10-17 – 2020-10-19 (×3): 200 mg via ORAL
  Filled 2020-10-16 (×4): qty 2

## 2020-10-16 MED ORDER — SODIUM CHLORIDE 0.9 % IV SOLN: 250.0000 mL | INTRAVENOUS | Status: DC

## 2020-10-16 MED ORDER — SODIUM CHLORIDE 0.9 % IV SOLN
1.5000 g | Freq: Two times a day (BID) | INTRAVENOUS | Status: AC
  Administered 2020-10-16 – 2020-10-18 (×4): 1.5 g via INTRAVENOUS
  Filled 2020-10-16 (×4): qty 1.5

## 2020-10-16 MED ORDER — NITROGLYCERIN IN D5W 200-5 MCG/ML-% IV SOLN: 0.0000 ug/min | INTRAVENOUS | Status: DC

## 2020-10-16 MED ORDER — SODIUM BICARBONATE 8.4 % IV SOLN
100.0000 meq | Freq: Once | INTRAVENOUS | Status: AC
  Administered 2020-10-16: 100 meq via INTRAVENOUS

## 2020-10-16 MED ORDER — PROPOFOL 10 MG/ML IV BOLUS
INTRAVENOUS | Status: AC
  Filled 2020-10-16: qty 20

## 2020-10-16 MED ORDER — LACTATED RINGERS IV SOLN: 500.0000 mL | Freq: Once | INTRAVENOUS | Status: DC | PRN

## 2020-10-16 MED ORDER — HEPARIN SODIUM (PORCINE) 1000 UNIT/ML IJ SOLN
INTRAMUSCULAR | Status: DC | PRN
  Administered 2020-10-16: 3000 [IU] via INTRAVENOUS
  Administered 2020-10-16: 20000 [IU] via INTRAVENOUS

## 2020-10-16 MED ORDER — ROCURONIUM BROMIDE 10 MG/ML (PF) SYRINGE
PREFILLED_SYRINGE | INTRAVENOUS | Status: DC | PRN
  Administered 2020-10-16: 20 mg via INTRAVENOUS
  Administered 2020-10-16: 30 mg via INTRAVENOUS
  Administered 2020-10-16: 20 mg via INTRAVENOUS
  Administered 2020-10-16: 100 mg via INTRAVENOUS
  Administered 2020-10-16: 50 mg via INTRAVENOUS
  Administered 2020-10-16: 30 mg via INTRAVENOUS

## 2020-10-16 MED ORDER — ACETAMINOPHEN 650 MG RE SUPP
650.0000 mg | Freq: Once | RECTAL | Status: AC
  Administered 2020-10-16: 650 mg via RECTAL

## 2020-10-16 MED ORDER — LACTATED RINGERS IV SOLN: INTRAVENOUS | Status: DC

## 2020-10-16 MED ORDER — FENTANYL CITRATE (PF) 250 MCG/5ML IJ SOLN
INTRAMUSCULAR | Status: AC
  Filled 2020-10-16: qty 25

## 2020-10-16 MED ORDER — ASPIRIN 81 MG PO CHEW: 324.0000 mg | CHEWABLE_TABLET | Freq: Every day | ORAL | Status: DC

## 2020-10-16 MED ORDER — CHLORHEXIDINE GLUCONATE 0.12 % MT SOLN
15.0000 mL | OROMUCOSAL | Status: AC
  Administered 2020-10-16: 15 mL via OROMUCOSAL

## 2020-10-16 MED ORDER — ASPIRIN EC 325 MG PO TBEC
325.0000 mg | DELAYED_RELEASE_TABLET | Freq: Every day | ORAL | Status: DC
  Administered 2020-10-17 – 2020-10-19 (×3): 325 mg via ORAL
  Filled 2020-10-16 (×3): qty 1

## 2020-10-16 MED ORDER — SODIUM CHLORIDE 0.9% FLUSH: 3.0000 mL | INTRAVENOUS | Status: DC | PRN

## 2020-10-16 MED ORDER — PANTOPRAZOLE SODIUM 40 MG PO TBEC
40.0000 mg | DELAYED_RELEASE_TABLET | Freq: Every day | ORAL | Status: DC
  Administered 2020-10-18 – 2020-10-20 (×3): 40 mg via ORAL
  Filled 2020-10-16 (×3): qty 1

## 2020-10-16 MED ORDER — CHLORHEXIDINE GLUCONATE CLOTH 2 % EX PADS
6.0000 | MEDICATED_PAD | Freq: Every day | CUTANEOUS | Status: DC
  Administered 2020-10-16 – 2020-10-18 (×2): 6 via TOPICAL

## 2020-10-16 MED ORDER — MIDAZOLAM HCL 5 MG/5ML IJ SOLN
INTRAMUSCULAR | Status: DC | PRN
  Administered 2020-10-16: 2 mg via INTRAVENOUS
  Administered 2020-10-16: 4 mg via INTRAVENOUS
  Administered 2020-10-16: 2 mg via INTRAVENOUS
  Administered 2020-10-16 (×2): 1 mg via INTRAVENOUS

## 2020-10-16 MED ORDER — POTASSIUM CHLORIDE 10 MEQ/50ML IV SOLN: 10.0000 meq | INTRAVENOUS | Status: AC

## 2020-10-16 MED ORDER — METOCLOPRAMIDE HCL 5 MG/ML IJ SOLN
10.0000 mg | Freq: Four times a day (QID) | INTRAMUSCULAR | Status: AC
  Administered 2020-10-16 – 2020-10-17 (×3): 10 mg via INTRAVENOUS
  Filled 2020-10-16 (×3): qty 2

## 2020-10-16 MED ORDER — DEXMEDETOMIDINE HCL IN NACL 400 MCG/100ML IV SOLN: 0.0000 ug/kg/h | INTRAVENOUS | Status: DC

## 2020-10-16 MED ORDER — BISACODYL 5 MG PO TBEC
10.0000 mg | DELAYED_RELEASE_TABLET | Freq: Every day | ORAL | Status: DC
  Administered 2020-10-17 – 2020-10-19 (×3): 10 mg via ORAL
  Filled 2020-10-16 (×4): qty 2

## 2020-10-16 MED ORDER — POTASSIUM CHLORIDE 10 MEQ/50ML IV SOLN
10.0000 meq | INTRAVENOUS | Status: AC
  Administered 2020-10-16 – 2020-10-17 (×3): 10 meq via INTRAVENOUS
  Filled 2020-10-16 (×6): qty 50

## 2020-10-16 MED ORDER — ALBUTEROL SULFATE (2.5 MG/3ML) 0.083% IN NEBU
2.5000 mg | INHALATION_SOLUTION | Freq: Once | RESPIRATORY_TRACT | Status: AC
  Administered 2020-10-16: 2.5 mg via RESPIRATORY_TRACT

## 2020-10-16 MED ORDER — MAGNESIUM SULFATE 4 GM/100ML IV SOLN
4.0000 g | Freq: Once | INTRAVENOUS | Status: AC
  Administered 2020-10-16: 4 g via INTRAVENOUS
  Filled 2020-10-16: qty 100

## 2020-10-16 MED ORDER — FENTANYL CITRATE (PF) 100 MCG/2ML IJ SOLN
50.0000 ug | INTRAMUSCULAR | Status: DC | PRN
  Administered 2020-10-16: 100 ug via INTRAVENOUS
  Administered 2020-10-16 (×2): 50 ug via INTRAVENOUS
  Administered 2020-10-17: 100 ug via INTRAVENOUS
  Administered 2020-10-17: 50 ug via INTRAVENOUS
  Administered 2020-10-17 (×2): 100 ug via INTRAVENOUS
  Administered 2020-10-18: 50 ug via INTRAVENOUS
  Administered 2020-10-18: 100 ug via INTRAVENOUS
  Filled 2020-10-16 (×9): qty 2

## 2020-10-16 MED ORDER — METOPROLOL TARTRATE 12.5 MG HALF TABLET
12.5000 mg | ORAL_TABLET | Freq: Two times a day (BID) | ORAL | Status: DC
  Administered 2020-10-16: 12.5 mg via ORAL
  Filled 2020-10-16: qty 1

## 2020-10-16 MED ORDER — PLASMA-LYTE 148 IV SOLN
INTRAVENOUS | Status: DC | PRN
  Administered 2020-10-16: 500 mL

## 2020-10-16 MED ORDER — ONDANSETRON HCL 4 MG/2ML IJ SOLN
4.0000 mg | Freq: Four times a day (QID) | INTRAMUSCULAR | Status: DC | PRN
  Administered 2020-10-17: 4 mg via INTRAVENOUS
  Filled 2020-10-16: qty 2

## 2020-10-16 MED ORDER — SODIUM CHLORIDE 0.9% FLUSH
3.0000 mL | Freq: Two times a day (BID) | INTRAVENOUS | Status: DC
  Administered 2020-10-17 – 2020-10-19 (×5): 3 mL via INTRAVENOUS

## 2020-10-16 MED ORDER — FAMOTIDINE IN NACL 20-0.9 MG/50ML-% IV SOLN
20.0000 mg | Freq: Two times a day (BID) | INTRAVENOUS | Status: AC
  Administered 2020-10-16 (×2): 20 mg via INTRAVENOUS
  Filled 2020-10-16: qty 50

## 2020-10-16 MED ORDER — BISACODYL 10 MG RE SUPP: 10.0000 mg | Freq: Every day | RECTAL | Status: DC

## 2020-10-16 MED ORDER — FENTANYL CITRATE (PF) 250 MCG/5ML IJ SOLN
INTRAMUSCULAR | Status: DC | PRN
  Administered 2020-10-16: 200 ug via INTRAVENOUS
  Administered 2020-10-16 (×3): 250 ug via INTRAVENOUS
  Administered 2020-10-16: 50 ug via INTRAVENOUS
  Administered 2020-10-16: 100 ug via INTRAVENOUS
  Administered 2020-10-16: 150 ug via INTRAVENOUS

## 2020-10-16 MED ORDER — ALBUMIN HUMAN 5 % IV SOLN: INTRAVENOUS | Status: DC | PRN

## 2020-10-16 MED ORDER — METOPROLOL TARTRATE 25 MG/10 ML ORAL SUSPENSION: 12.5000 mg | Freq: Two times a day (BID) | ORAL | Status: DC

## 2020-10-16 MED ORDER — ESMOLOL HCL 100 MG/10ML IV SOLN
INTRAVENOUS | Status: DC | PRN
  Administered 2020-10-16: 1 mg via INTRAVENOUS

## 2020-10-16 MED ORDER — HEPARIN SODIUM (PORCINE) 1000 UNIT/ML IJ SOLN
INTRAMUSCULAR | Status: AC
  Filled 2020-10-16: qty 1

## 2020-10-16 MED ORDER — INSULIN REGULAR(HUMAN) IN NACL 100-0.9 UT/100ML-% IV SOLN
INTRAVENOUS | Status: DC
  Administered 2020-10-16: 1.4 [IU]/h via INTRAVENOUS

## 2020-10-16 MED ORDER — ROCURONIUM BROMIDE 10 MG/ML (PF) SYRINGE
PREFILLED_SYRINGE | INTRAVENOUS | Status: AC
  Filled 2020-10-16: qty 20

## 2020-10-16 MED ORDER — VANCOMYCIN HCL 1000 MG IV SOLR
INTRAVENOUS | Status: DC | PRN
  Administered 2020-10-16: 1000 mL

## 2020-10-16 MED ORDER — SODIUM CHLORIDE 0.45 % IV SOLN: INTRAVENOUS | Status: DC | PRN

## 2020-10-16 MED ORDER — SODIUM CHLORIDE 0.9 % IV SOLN: INTRAVENOUS | Status: DC

## 2020-10-16 MED ORDER — SODIUM CHLORIDE 0.9 % IV SOLN: INTRAVENOUS | Status: AC

## 2020-10-16 MED ORDER — SODIUM CHLORIDE (PF) 0.9 % IJ SOLN
OROMUCOSAL | Status: DC | PRN
  Administered 2020-10-16 (×5): 4 mL via TOPICAL

## 2020-10-16 MED ORDER — METOPROLOL TARTRATE 5 MG/5ML IV SOLN: 2.5000 mg | INTRAVENOUS | Status: DC | PRN

## 2020-10-16 MED ORDER — VANCOMYCIN HCL IN DEXTROSE 1-5 GM/200ML-% IV SOLN
1000.0000 mg | Freq: Once | INTRAVENOUS | Status: AC
  Administered 2020-10-16: 1000 mg via INTRAVENOUS
  Filled 2020-10-16: qty 200

## 2020-10-16 MED ORDER — ACETAMINOPHEN 160 MG/5ML PO SOLN: 650.0000 mg | Freq: Once | ORAL | Status: AC

## 2020-10-16 MED ORDER — ROCURONIUM BROMIDE 10 MG/ML (PF) SYRINGE
PREFILLED_SYRINGE | INTRAVENOUS | Status: AC
  Filled 2020-10-16: qty 10

## 2020-10-16 MED ORDER — MIDAZOLAM HCL 2 MG/2ML IJ SOLN: 2.0000 mg | INTRAMUSCULAR | Status: DC | PRN

## 2020-10-16 MED ORDER — ESMOLOL HCL 100 MG/10ML IV SOLN
INTRAVENOUS | Status: AC
  Filled 2020-10-16: qty 10

## 2020-10-16 MED ORDER — 0.9 % SODIUM CHLORIDE (POUR BTL) OPTIME
TOPICAL | Status: DC | PRN
  Administered 2020-10-16: 5000 mL

## 2020-10-16 MED ORDER — ALBUMIN HUMAN 5 % IV SOLN
250.0000 mL | INTRAVENOUS | Status: AC | PRN
Start: 2020-10-16 — End: 2020-10-17

## 2020-10-16 MED ORDER — ACETAMINOPHEN 500 MG PO TABS
1000.0000 mg | ORAL_TABLET | Freq: Four times a day (QID) | ORAL | Status: DC
  Administered 2020-10-16 – 2020-10-20 (×13): 1000 mg via ORAL
  Filled 2020-10-16 (×14): qty 2

## 2020-10-16 MED ORDER — PROTAMINE SULFATE 10 MG/ML IV SOLN
INTRAVENOUS | Status: AC
  Filled 2020-10-16: qty 25

## 2020-10-16 MED ORDER — TRAMADOL HCL 50 MG PO TABS
50.0000 mg | ORAL_TABLET | ORAL | Status: DC | PRN
  Administered 2020-10-17 – 2020-10-18 (×4): 100 mg via ORAL
  Filled 2020-10-16 (×5): qty 2

## 2020-10-16 MED ORDER — MIDAZOLAM HCL (PF) 10 MG/2ML IJ SOLN
INTRAMUSCULAR | Status: AC
  Filled 2020-10-16: qty 2

## 2020-10-16 SURGICAL SUPPLY — 92 items
BAG DECANTER FOR FLEXI CONT (MISCELLANEOUS) ×6 IMPLANT
BLADE CLIPPER SURG (BLADE) ×3 IMPLANT
BLADE STERNUM SYSTEM 6 (BLADE) ×3 IMPLANT
BNDG ELASTIC 4X5.8 VLCR STR LF (GAUZE/BANDAGES/DRESSINGS) ×3 IMPLANT
BNDG ELASTIC 6X5.8 VLCR STR LF (GAUZE/BANDAGES/DRESSINGS) ×3 IMPLANT
BNDG GAUZE ELAST 4 BULKY (GAUZE/BANDAGES/DRESSINGS) ×3 IMPLANT
CANISTER SUCT 3000ML PPV (MISCELLANEOUS) ×3 IMPLANT
CANNULA EZ GLIDE AORTIC 21FR (CANNULA) ×3 IMPLANT
CATH CPB KIT OWEN (MISCELLANEOUS) ×3 IMPLANT
CATH THORACIC 36FR (CATHETERS) ×3 IMPLANT
CLIP RETRACTION 3.0MM CORONARY (MISCELLANEOUS) ×3 IMPLANT
CLIP VESOCCLUDE MED 24/CT (CLIP) IMPLANT
CLIP VESOCCLUDE SM WIDE 24/CT (CLIP) ×3 IMPLANT
CONN ST 1/4X3/8  BEN (MISCELLANEOUS) ×2
CONN ST 1/4X3/8 BEN (MISCELLANEOUS) ×4 IMPLANT
DERMABOND ADVANCED (GAUZE/BANDAGES/DRESSINGS) ×1
DERMABOND ADVANCED .7 DNX12 (GAUZE/BANDAGES/DRESSINGS) ×2 IMPLANT
DRAIN CHANNEL 32F RND 10.7 FF (WOUND CARE) ×9 IMPLANT
DRAPE CARDIOVASCULAR INCISE (DRAPES) ×2
DRAPE INCISE IOBAN 66X45 STRL (DRAPES) ×3 IMPLANT
DRAPE SLUSH/WARMER DISC (DRAPES) ×3 IMPLANT
DRAPE SRG 135X102X78XABS (DRAPES) ×2 IMPLANT
DRSG AQUACEL AG ADV 3.5X14 (GAUZE/BANDAGES/DRESSINGS) ×3 IMPLANT
ELECT BLADE 4.0 EZ CLEAN MEGAD (MISCELLANEOUS) ×3
ELECT REM PT RETURN 9FT ADLT (ELECTROSURGICAL) ×6
ELECTRODE BLDE 4.0 EZ CLN MEGD (MISCELLANEOUS) ×2 IMPLANT
ELECTRODE REM PT RTRN 9FT ADLT (ELECTROSURGICAL) ×4 IMPLANT
FELT TEFLON 1X6 (MISCELLANEOUS) ×3 IMPLANT
GAUZE SPONGE 4X4 12PLY STRL (GAUZE/BANDAGES/DRESSINGS) ×6 IMPLANT
GAUZE SPONGE 4X4 12PLY STRL LF (GAUZE/BANDAGES/DRESSINGS) ×6 IMPLANT
GLOVE ORTHO TXT STRL SZ7.5 (GLOVE) ×6 IMPLANT
GOWN STRL REUS W/ TWL LRG LVL3 (GOWN DISPOSABLE) ×8 IMPLANT
GOWN STRL REUS W/TWL LRG LVL3 (GOWN DISPOSABLE) ×8
HEMOSTAT POWDER SURGIFOAM 1G (HEMOSTASIS) ×9 IMPLANT
INSERT FOGARTY XLG (MISCELLANEOUS) ×3 IMPLANT
KIT BASIN OR (CUSTOM PROCEDURE TRAY) ×3 IMPLANT
KIT SUCTION CATH 14FR (SUCTIONS) ×9 IMPLANT
KIT TURNOVER KIT B (KITS) ×3 IMPLANT
KIT VASOVIEW HEMOPRO 2 VH 4000 (KITS) ×3 IMPLANT
LEAD PACING MYOCARDI (MISCELLANEOUS) ×3 IMPLANT
MARKER GRAFT CORONARY BYPASS (MISCELLANEOUS) ×9 IMPLANT
NS IRRIG 1000ML POUR BTL (IV SOLUTION) ×15 IMPLANT
PACK E OPEN HEART (SUTURE) ×3 IMPLANT
PACK OPEN HEART (CUSTOM PROCEDURE TRAY) ×3 IMPLANT
PAD ARMBOARD 7.5X6 YLW CONV (MISCELLANEOUS) ×6 IMPLANT
PAD ELECT DEFIB RADIOL ZOLL (MISCELLANEOUS) ×3 IMPLANT
PENCIL BUTTON HOLSTER BLD 10FT (ELECTRODE) ×3 IMPLANT
POSITIONER HEAD DONUT 9IN (MISCELLANEOUS) ×3 IMPLANT
PUNCH AORTIC ROTATE 4.0MM (MISCELLANEOUS) ×3 IMPLANT
PUNCH AORTIC ROTATE 4.5MM 8IN (MISCELLANEOUS) IMPLANT
SET CARDIOPLEGIA MPS 5001102 (MISCELLANEOUS) ×3 IMPLANT
SPONGE LAP 18X18 RF (DISPOSABLE) ×3 IMPLANT
SPONGE LAP 4X18 RFD (DISPOSABLE) ×3 IMPLANT
SUPPORT HEART JANKE-BARRON (MISCELLANEOUS) ×3 IMPLANT
SUT BONE WAX W31G (SUTURE) ×3 IMPLANT
SUT ETHIBOND X763 2 0 SH 1 (SUTURE) ×6 IMPLANT
SUT MNCRL AB 3-0 PS2 18 (SUTURE) ×6 IMPLANT
SUT MNCRL AB 4-0 PS2 18 (SUTURE) IMPLANT
SUT PDS AB 1 CTX 36 (SUTURE) ×6 IMPLANT
SUT PROLENE 2 0 SH DA (SUTURE) IMPLANT
SUT PROLENE 3 0 SH DA (SUTURE) ×6 IMPLANT
SUT PROLENE 3 0 SH1 36 (SUTURE) ×12 IMPLANT
SUT PROLENE 4 0 RB 1 (SUTURE)
SUT PROLENE 4 0 SH DA (SUTURE) IMPLANT
SUT PROLENE 4-0 RB1 .5 CRCL 36 (SUTURE) IMPLANT
SUT PROLENE 5 0 C 1 36 (SUTURE) IMPLANT
SUT PROLENE 6 0 C 1 30 (SUTURE) ×9 IMPLANT
SUT PROLENE 7.0 RB 3 (SUTURE) ×9 IMPLANT
SUT PROLENE 8 0 BV175 6 (SUTURE) ×18 IMPLANT
SUT PROLENE BLUE 7 0 (SUTURE) ×3 IMPLANT
SUT PROLENE POLY MONO (SUTURE) ×3 IMPLANT
SUT SILK  1 MH (SUTURE) ×4
SUT SILK 1 MH (SUTURE) ×4 IMPLANT
SUT VIC AB 1 CTX 36 (SUTURE)
SUT VIC AB 1 CTX36XBRD ANBCTR (SUTURE) IMPLANT
SUT VIC AB 2-0 CT1 27 (SUTURE)
SUT VIC AB 2-0 CT1 TAPERPNT 27 (SUTURE) IMPLANT
SUT VIC AB 2-0 CTX 27 (SUTURE) IMPLANT
SUT VIC AB 3-0 SH 27 (SUTURE)
SUT VIC AB 3-0 SH 27X BRD (SUTURE) IMPLANT
SUT VIC AB 3-0 X1 27 (SUTURE) IMPLANT
SUT VICRYL 4-0 PS2 18IN ABS (SUTURE) IMPLANT
SYSTEM SAHARA CHEST DRAIN ATS (WOUND CARE) ×3 IMPLANT
TAPE CLOTH SURG 4X10 WHT LF (GAUZE/BANDAGES/DRESSINGS) ×3 IMPLANT
TAPE PAPER 2X10 WHT MICROPORE (GAUZE/BANDAGES/DRESSINGS) ×3 IMPLANT
TOWEL GREEN STERILE (TOWEL DISPOSABLE) ×3 IMPLANT
TOWEL GREEN STERILE FF (TOWEL DISPOSABLE) ×3 IMPLANT
TRAY FOLEY SLVR 16FR TEMP STAT (SET/KITS/TRAYS/PACK) ×3 IMPLANT
TUBING LAP HI FLOW INSUFFLATIO (TUBING) ×3 IMPLANT
UNDERPAD 30X36 HEAVY ABSORB (UNDERPADS AND DIAPERS) ×3 IMPLANT
WATER STERILE IRR 1000ML POUR (IV SOLUTION) ×6 IMPLANT
YANKAUER SUCT BULB TIP NO VENT (SUCTIONS) ×3 IMPLANT

## 2020-10-16 NOTE — Anesthesia Procedure Notes (Signed)
Procedure Name: Intubation Date/Time: 10/16/2020 8:14 AM Performed by: Trinna Post., CRNA Pre-anesthesia Checklist: Patient identified, Emergency Drugs available, Suction available, Patient being monitored and Timeout performed Patient Re-evaluated:Patient Re-evaluated prior to induction Oxygen Delivery Method: Circle system utilized Preoxygenation: Pre-oxygenation with 100% oxygen Induction Type: IV induction Ventilation: Mask ventilation without difficulty Laryngoscope Size: Mac and 3 Grade View: Grade II Tube type: Oral Tube size: 7.5 mm Number of attempts: 1 Airway Equipment and Method: Stylet Placement Confirmation: ETT inserted through vocal cords under direct vision,  positive ETCO2 and breath sounds checked- equal and bilateral Secured at: 22 cm Tube secured with: Tape Dental Injury: Teeth and Oropharynx as per pre-operative assessment

## 2020-10-16 NOTE — Progress Notes (Signed)
TCTS Evening Rounds  DOS s/p CABG x 3 Waking up ABG with mild acidosis; little CT output BP 106/79   Pulse 79   Temp (!) 97.34 F (36.3 C) (Core (Comment))   Resp 20   Ht 5\' 3"  (1.6 m)   Wt 64.8 kg   SpO2 99%   BMI 25.30 kg/m  Responsive Diminished BS bases RRR (a-paced)  Intake/Output Summary (Last 24 hours) at 10/16/2020 1620 Last data filed at 10/16/2020 1555 Gross per 24 hour  Intake 3300 ml  Output 2900 ml  Net 400 ml    A/p: 2 amps bicarb Increase PEEP Repeat ABG Extubate later Kennedy Brines Z. 10/18/2020, MD [phone]

## 2020-10-16 NOTE — Brief Op Note (Signed)
10/12/2020 - 10/16/2020  1:10 PM  PATIENT:  April Bryant  60 y.o. female  PRE-OPERATIVE DIAGNOSIS:  Coronary Artery Disease  POST-OPERATIVE DIAGNOSIS:  Coronary Artery Disease  PROCEDURE:  Procedure(s):  CORONARY ARTERY BYPASS GRAFTING x 3 -FREE LIMA to LAD -SVG to RAMUS INTERMEDIATE -SVG to PDA  ENDOSCOPIC HARVEST OF GREATER SAPHENOUS VEIN -Right Thigh  TRANSESOPHAGEAL ECHOCARDIOGRAM (TEE) (N/A)  SURGEON:  Surgeon(s) and Role:    Purcell Nails, MD - Primary  PHYSICIAN ASSISTANT: Lowella Dandy PA-C  ANESTHESIA:   general  EBL:  Per perfusion record  BLOOD ADMINISTERED: CELLSAVER  DRAINS: Left Pleural Chest Tubes, Mediastinal Chest Drains   LOCAL MEDICATIONS USED:  NONE  SPECIMEN:  No Specimen  DISPOSITION OF SPECIMEN:  N/A  COUNTS:  YES  TOURNIQUET:  * No tourniquets in log *  DICTATION: .Dragon Dictation  PLAN OF CARE: Admit to inpatient   PATIENT DISPOSITION:  ICU - intubated and hemodynamically stable.   Delay start of Pharmacological VTE agent (>24hrs) due to surgical blood loss or risk of bleeding: yes

## 2020-10-16 NOTE — Op Note (Signed)
CARDIOTHORACIC SURGERY OPERATIVE NOTE  Date of Procedure: 10/16/2020  Preoperative Diagnosis:   Severe 3-vessel Coronary Artery Disease  S/P Acute Non-ST Segment Elevation Myocardial Infarction  Postoperative Diagnosis: Same  Procedure:    Coronary Artery Bypass Grafting x 3   Left Internal Mammary Artery to Distal Left Anterior Descending Coronary Artery  Saphenous Vein Graft to Posterior Descending Coronary Artery  Saphenous Vein Graft to Intermediate Branch Coronary Artery  Endoscopic Vein Harvest from Right Thigh   Surgeon: Salvatore Decent. Cornelius Moras, MD  Assistant: Lowella Dandy, PA-C  Anesthesia: Mal Amabile, MD  Operative Findings:  Normal left ventricular systolic function  Small caliber fair quality left internal mammary artery conduit  Good quality saphenous vein conduit  Good quality target vessels for grafting    BRIEF CLINICAL NOTE AND INDICATIONS FOR SURGERY  Patient is a 60 year old female with no previous history of coronary artery disease but risk factors notable for history of hypertension and longstanding tobacco abuse.  She describes a several month history of intermittent episodes of tightness in the upper chest and neck which previously had occurred primarily with physical activity.  Recently she has had a couple of episodes at rest including a particular severe episode that occurred yesterday, prompting her to summon EMS.  Chest pain resolved prior to transport to the hospital.  Baseline EKG revealed nonspecific ST changes but troponins were positive and trended up, consistent with non-ST segment elevation myocardial infarction.  The patient has not had any recurrent chest pain since admission.   DETAILS OF THE OPERATIVE PROCEDURE  Preparation:  The patient is brought to the operating room on the above mentioned date and central monitoring was established by the anesthesia team including placement of Swan-Ganz catheter and radial arterial line. The patient is  placed in the supine position on the operating table.  Intravenous antibiotics are administered. General endotracheal anesthesia is induced uneventfully. A Foley catheter is placed.  Baseline transesophageal echocardiogram was performed.  Findings were notable for normal LV size and systolic function.  The patient's chest, abdomen, both groins, and both lower extremities are prepared and draped in a sterile manner. A time out procedure is performed.   Surgical Approach and Conduit Harvest:  A median sternotomy incision was performed and the left internal mammary artery is dissected from the chest wall and prepared for bypass grafting. The left internal mammary artery is notably small caliber fair quality conduit and is too short to be utilized as an in-situ graft.  . Simultaneously, the greater saphenous vein is obtained from the patient's right thigh using endoscopic vein harvest technique. The saphenous vein is notably good quality conduit. After removal of the saphenous vein, the small surgical incisions in the lower extremity are closed with absorbable suture. Following systemic heparinization, the left internal mammary artery was transected both proximally and distally to be utilized as a free graft.   Extracorporeal Cardiopulmonary Bypass and Myocardial Protection:  The pericardium is opened. The ascending aorta is normal in appearance. The ascending aorta and the right atrium are cannulated for cardiopulmonary bypass.  Adequate heparinization is verified.     The entire pre-bypass portion of the operation was notable for stable hemodynamics.  Cardiopulmonary bypass was begun and the surface of the heart is inspected. Distal target vessels are selected for coronary artery bypass grafting. A cardioplegia cannula is placed in the ascending aorta.  A temperature probe was placed in the interventricular septum.  The patient is allowed to cool passively to [facility] systemic temperature.  The aortic  cross clamp is applied and cold blood cardioplegia is delivered initially in an antegrade fashion through the aortic root. Iced saline slush is applied for topical hypothermia.  The initial cardioplegic arrest is rapid with early diastolic arrest.  Repeat doses of cardioplegia are administered intermittently throughout the entire cross clamp portion of the operation through the aortic root and through subsequently placed vein grafts in order to maintain completely flat electrocardiogram and septal myocardial temperature below 15C.  Myocardial protection was felt to be excellent.   Coronary Artery Bypass Grafting:   The posterior descending branch of the right coronary artery was grafted using a reversed saphenous vein graft in an end-to-side fashion.  At the site of distal anastomosis the target vessel was fair to good quality and measured approximately 2.0 mm in diameter.  The ramus intermediate branch coronary artery was grafted using a reversed saphenous vein graft in an end-to-side fashion.  At the site of distal anastomosis the target vessel was good quality and measured approximately 2.2 mm in diameter.  The distal left anterior coronary artery was grafted with the left internal mammary artery as a free graft in an end-to-side fashion.  At the site of distal anastomosis the target vessel was good quality and measured approximately 1.5 mm in diameter.  Both proximal vein graft anastomoses were placed directly to the ascending aorta prior to removal of the aortic cross clamp.  A short segment of saphenous vein was utilized to attach the proximal end of the left internal mammary artery graft.  The aortic cross clamp was removed after a total cross clamp time of 75 minutes.   Procedure Completion:  All 3 proximal and distal coronary anastomoses were inspected for hemostasis and appropriate graft orientation. Epicardial pacing wires are fixed to the right ventricular outflow tract and to the right  atrial appendage. The patient is rewarmed to 37C temperature. The patient is weaned and disconnected from cardiopulmonary bypass.  The patient's rhythm at separation from bypass was sinus.  The patient was weaned from cardiopulmonary bypass without any inotropic support. Total cardiopulmonary bypass time for the operation was 106 minutes.  Followup transesophageal echocardiogram performed after separation from bypass revealed no changes from the preoperative exam.  The aortic and venous cannula were removed uneventfully. Protamine was administered to reverse the anticoagulation. The mediastinum and pleural space were inspected for hemostasis and irrigated with saline solution. The mediastinum and both pleural spaces were drained using 4 chest tubes placed through separate stab incisions inferiorly.  The soft tissues anterior to the aorta were reapproximated loosely. The sternum is closed with Fibertape cerclage. The soft tissues anterior to the sternum were closed in multiple layers and the skin is closed with a running subcuticular skin closure.  The post-bypass portion of the operation was notable for stable rhythm and hemodynamics.  No blood products were administered during the operation.   Disposition:  The patient tolerated the procedure well and is transported to the surgical intensive care in stable condition. There are no intraoperative complications. All sponge instrument and needle counts are verified correct at completion of the operation.    Salvatore Decent. Cornelius Moras MD 10/16/2020 1:11 PM

## 2020-10-16 NOTE — Progress Notes (Signed)
      [address]       [place] [address]             [phone]     CARDIOTHORACIC SURGERY PROGRESS NOTE  Subjective: April Bryant has been scheduled for Procedure(s): CORONARY ARTERY BYPASS GRAFTING (CABG) (N/A) TRANSESOPHAGEAL ECHOCARDIOGRAM (TEE) (N/A) today.   Objective: Vital signs in last 24 hours: Temp:  [97.9 F (36.6 C)-98.6 F (37 C)] 98.6 F (37 C) (04/15 0528) Pulse Rate:  [62-78] 76 (04/15 0528) Cardiac Rhythm: Normal sinus rhythm (04/14 1900) Resp:  [16-19] 19 (04/15 0528) BP: (118-185)/(73-106) 185/105 (04/15 0528) SpO2:  [98 %-99 %] 98 % (04/15 0528) Weight:  [64.8 kg] 64.8 kg (04/15 0528)  Physical Exam: Unchanged from previously   Intake/Output from previous day: 04/14 0701 - 04/15 0700 In: 480 [P.O.:480] Out: 2500 [Urine:2500] Intake/Output this shift: Total I/O In: -  Out: 900 [Urine:900]  Lab Results: Recent Labs    10/15/20 0306 10/16/20 0405  WBC 6.3 6.6  HGB 12.4 12.4  HCT 39.1 39.2  PLT 111* 105*   BMET:  Recent Labs    10/15/20 0306 10/16/20 0405  NA 141 140  K 3.9 4.2  CL 110 109  CO2 24 22  GLUCOSE 90 111*  BUN 10 15  CREATININE 0.84 0.71  CALCIUM 9.1 9.2    CBG (last 3)  No results for input(s): GLUCAP in the last 72 hours. PT/INR:   Recent Labs    10/14/20 0232  LABPROT 13.5  INR 1.0    Assessment/Plan:   The various methods of treatment have been discussed with the patient. After consideration of the risks, benefits and treatment options the patient has consented to the planned procedure.   The patient has been seen and labs reviewed. There are no changes in the patient's condition to prevent proceeding with the planned procedure today.   Purcell Nails, MD 10/16/2020 6:37 AM

## 2020-10-16 NOTE — Progress Notes (Signed)
  Echocardiogram Echocardiogram Transesophageal has been performed.  April Bryant 10/16/2020, 9:25 AM

## 2020-10-16 NOTE — Anesthesia Procedure Notes (Signed)
Arterial Line Insertion Start/End4/15/2022 7:20 AM, 10/16/2020 7:25 AM Performed by: Shelton Silvas, MD, anesthesiologist  Patient location: Pre-op. Preanesthetic checklist: patient identified, IV checked, site marked, risks and benefits discussed, surgical consent, monitors and equipment checked, pre-op evaluation, timeout performed and anesthesia consent Lidocaine 1% used for infiltration Left, radial was placed Catheter size: 20 Fr Hand hygiene performed  and maximum sterile barriers used   Attempts: 1 Procedure performed without using ultrasound guided technique. Following insertion, dressing applied and Biopatch. Post procedure assessment: normal and unchanged  Patient tolerated the procedure well with no immediate complications. Additional procedure comments: CRNA attempt x2.

## 2020-10-16 NOTE — Anesthesia Preprocedure Evaluation (Addendum)
Anesthesia Evaluation  Patient identified by MRN, date of birth, ID band Patient awake    Reviewed: Allergy & Precautions, NPO status , Patient's Chart, lab work & pertinent test results  History of Anesthesia Complications Negative for: history of anesthetic complications  Airway Mallampati: II  TM Distance: >3 FB Neck ROM: Full    Dental  (+) Teeth Intact, Dental Advisory Given   Pulmonary Current Smoker,    breath sounds clear to auscultation       Cardiovascular hypertension, Pt. on medications + CAD and + Past MI  + Valvular Problems/Murmurs MR  Rhythm:Regular Rate:Normal   '22 Carotid US - 1-39% b/l ICAS  '22 Cath - Culprit lesion is a large branching ramus intermedius and has ostial to proximal narrowing without landing zone for PCI. Left main is widely patent LAD proximal to mid segment with intermediate stenosis in the 65 to 75% range documented to be hemodynamically significant by both RFR (0.82) and FFR (0.72). Circumflex is relatively small.  There is a moderate-sized third obtuse marginal.  The mid vessel contains eccentric 50 to 60% narrowing. RCA is dominant.  There is significant tortuosity.  The distal RCA before the PDA contains segmental 50% narrowing.  The ostial to proximal PDA is 60-70% narrowed. Overall LV function is normal with EF 60%.  '22 TTE - EF 55 to 60%. Grade II diastolic dysfunction (pseudonormalization). There is moderately elevated pulmonary artery systolic pressure. Left atrial size was mild to moderately dilated. Moderate mitral valve regurgitation.     Neuro/Psych negative neurological ROS  negative psych ROS   GI/Hepatic negative GI ROS, Neg liver ROS,   Endo/Other  negative endocrine ROS  Renal/GU negative Renal ROS     Musculoskeletal  (+) Arthritis , Rheumatoid disorders,    Abdominal Normal abdominal exam  (+)   Peds  Hematology negative hematology ROS (+)   Thrombocytopenia    Anesthesia Other Findings   Reproductive/Obstetrics                            Anesthesia Physical Anesthesia Plan  ASA: IV  Anesthesia Plan: General   Post-op Pain Management:    Induction: Intravenous  PONV Risk Score and Plan: 3 and Treatment may vary due to age or medical condition and Ondansetron  Airway Management Planned: Oral ETT  Additional Equipment: Arterial line, CVP, PA Cath, TEE and Ultrasound Guidance Line Placement  Intra-op Plan:   Post-operative Plan: Extubation in OR  Informed Consent: I have reviewed the patients History and Physical, chart, labs and discussed the procedure including the risks, benefits and alternatives for the proposed anesthesia with the patient or authorized representative who has indicated his/her understanding and acceptance.       Plan Discussed with: CRNA and Anesthesiologist  Anesthesia Plan Comments:        Anesthesia Quick Evaluation

## 2020-10-16 NOTE — Progress Notes (Signed)
Recruitment maneuver performed with o2 saturations improved to 100%.  Albuterol treatment given.

## 2020-10-16 NOTE — Discharge Instructions (Signed)

## 2020-10-16 NOTE — Anesthesia Procedure Notes (Signed)
Central Venous Catheter Insertion Performed by: Shelton Silvas, MD, anesthesiologist Start/End4/15/2022 6:55 AM, 10/16/2020 7:05 AM Patient location: Pre-op. Preanesthetic checklist: patient identified, IV checked, site marked, risks and benefits discussed, surgical consent, monitors and equipment checked, pre-op evaluation, timeout performed and anesthesia consent Position: Trendelenburg Lidocaine 1% used for infiltration and patient sedated Hand hygiene performed , maximum sterile barriers used  and Seldinger technique used Catheter size: 8.5 Fr Total catheter length 10. Central line was placed.Sheath introducer Swan type:thermodilution PA Cath depth:50 Procedure performed using ultrasound guided technique. Ultrasound Notes:anatomy identified, needle tip was noted to be adjacent to the nerve/plexus identified, no ultrasound evidence of intravascular and/or intraneural injection and image(s) printed for medical record Attempts: 1 Following insertion, line sutured and dressing applied. Post procedure assessment: blood return through all ports, free fluid flow and no air  Patient tolerated the procedure well with no immediate complications.

## 2020-10-16 NOTE — Anesthesia Procedure Notes (Signed)
Central Venous Catheter Insertion Performed by: Shelton Silvas, MD, anesthesiologist Start/End4/15/2022 7:05 AM, 10/16/2020 7:10 AM Patient location: Pre-op. Preanesthetic checklist: patient identified, IV checked, site marked, risks and benefits discussed, surgical consent, monitors and equipment checked, pre-op evaluation, timeout performed and anesthesia consent Hand hygiene performed  and maximum sterile barriers used  PA cath was placed.Swan type:thermodilution Procedure performed without using ultrasound guided technique. Attempts: 1 Patient tolerated the procedure well with no immediate complications.

## 2020-10-16 NOTE — Transfer of Care (Signed)
Immediate Anesthesia Transfer of Care Note  Patient: April Bryant  Procedure(s) Performed: CORONARY ARTERY BYPASS GRAFTING (CABG), ON PUMP, TIMES THREE, USING LEFT INTERNAL MAMMARY ARTERY AND RIGHT ENDOSCOPICALLY HARVESTED GREATER SAPHENOUS VEIN (N/A Chest) TRANSESOPHAGEAL ECHOCARDIOGRAM (TEE) (N/A )  Patient Location: PACU and ICU  Anesthesia Type:General  Level of Consciousness: sedated and Patient remains intubated per anesthesia plan  Airway & Oxygen Therapy: Patient remains intubated per anesthesia plan and Patient placed on Ventilator (see vital sign flow sheet for setting)  Post-op Assessment: Report given to RN and Post -op Vital signs reviewed and stable  Post vital signs: Reviewed and stable  Last Vitals:  Vitals Value Taken Time  BP    Temp 35.8 C 10/16/20 1406  Pulse 75 10/16/20 1406  Resp 12 10/16/20 1406  SpO2 100 % 10/16/20 1406  Vitals shown include unvalidated device data.  Last Pain:  Vitals:   10/16/20 0528  TempSrc: Oral  PainSc:       Patients Stated Pain Goal: 0 (10/15/20 0835)  Complications: No complications documented.

## 2020-10-16 NOTE — Procedures (Signed)
Extubation Procedure Note  Patient Details:   Name: April Bryant DOB: [birth year] MRN: [id number]   Airway Documentation:    Vent end date: 10/16/20 Vent end time: 1800   Evaluation  O2 sats: stable throughout Complications: No apparent complications Patient did tolerate procedure well. Bilateral Breath Sounds: Diminished   Yes, pt could speak post extubation.  Pt extubated to 4 l/m [place] per protocol.  Audrie Lia 10/16/2020, 6:01 PM

## 2020-10-17 ENCOUNTER — Inpatient Hospital Stay (HOSPITAL_COMMUNITY): Payer: 59

## 2020-10-17 DIAGNOSIS — Z951 Presence of aortocoronary bypass graft: Secondary | ICD-10-CM

## 2020-10-17 LAB — POCT I-STAT 7, (LYTES, BLD GAS, ICA,H+H)
Acid-Base Excess: 0 mmol/L (ref 0.0–2.0)
Bicarbonate: 26.4 mmol/L (ref 20.0–28.0)
Calcium, Ion: 1.11 mmol/L — ABNORMAL LOW (ref 1.15–1.40)
HCT: 26 % — ABNORMAL LOW (ref 36.0–46.0)
Hemoglobin: 8.8 g/dL — ABNORMAL LOW (ref 12.0–15.0)
O2 Saturation: 95 %
Patient temperature: 36.7
Potassium: 3.9 mmol/L (ref 3.5–5.1)
Sodium: 144 mmol/L (ref 135–145)
TCO2: 28 mmol/L (ref 22–32)
pCO2 arterial: 49.3 mmHg — ABNORMAL HIGH (ref 32.0–48.0)
pH, Arterial: 7.336 — ABNORMAL LOW (ref 7.350–7.450)
pO2, Arterial: 82 mmHg — ABNORMAL LOW (ref 83.0–108.0)

## 2020-10-17 LAB — BPAM FFP
Blood Product Expiration Date: 202204192359
Blood Product Expiration Date: 202204192359
ISSUE DATE / TIME: 202204151427
ISSUE DATE / TIME: 202204151427
Unit Type and Rh: 6200
Unit Type and Rh: 6200

## 2020-10-17 LAB — CBC
HCT: 27.1 % — ABNORMAL LOW (ref 36.0–46.0)
HCT: 28 % — ABNORMAL LOW (ref 36.0–46.0)
Hemoglobin: 8.6 g/dL — ABNORMAL LOW (ref 12.0–15.0)
Hemoglobin: 8.7 g/dL — ABNORMAL LOW (ref 12.0–15.0)
MCH: 24.8 pg — ABNORMAL LOW (ref 26.0–34.0)
MCH: 24.6 pg — ABNORMAL LOW (ref 26.0–34.0)
MCHC: 31.7 g/dL (ref 30.0–36.0)
MCHC: 31.1 g/dL (ref 30.0–36.0)
MCV: 78.1 fL — ABNORMAL LOW (ref 80.0–100.0)
MCV: 79.3 fL — ABNORMAL LOW (ref 80.0–100.0)
Platelets: 119 10*3/uL — ABNORMAL LOW (ref 150–400)
Platelets: 130 10*3/uL — ABNORMAL LOW (ref 150–400)
RBC: 3.47 MIL/uL — ABNORMAL LOW (ref 3.87–5.11)
RBC: 3.53 MIL/uL — ABNORMAL LOW (ref 3.87–5.11)
RDW: 15.3 % (ref 11.5–15.5)
RDW: 15.4 % (ref 11.5–15.5)
WBC: 15.3 10*3/uL — ABNORMAL HIGH (ref 4.0–10.5)
WBC: 16.1 10*3/uL — ABNORMAL HIGH (ref 4.0–10.5)
nRBC: 0 % (ref 0.0–0.2)
nRBC: 0 % (ref 0.0–0.2)

## 2020-10-17 LAB — PREPARE FRESH FROZEN PLASMA
Unit division: 0
Unit division: 0

## 2020-10-17 LAB — GLUCOSE, CAPILLARY
Glucose-Capillary: 110 mg/dL — ABNORMAL HIGH (ref 70–99)
Glucose-Capillary: 111 mg/dL — ABNORMAL HIGH (ref 70–99)
Glucose-Capillary: 119 mg/dL — ABNORMAL HIGH (ref 70–99)
Glucose-Capillary: 125 mg/dL — ABNORMAL HIGH (ref 70–99)
Glucose-Capillary: 126 mg/dL — ABNORMAL HIGH (ref 70–99)
Glucose-Capillary: 135 mg/dL — ABNORMAL HIGH (ref 70–99)
Glucose-Capillary: 149 mg/dL — ABNORMAL HIGH (ref 70–99)
Glucose-Capillary: 159 mg/dL — ABNORMAL HIGH (ref 70–99)
Glucose-Capillary: 163 mg/dL — ABNORMAL HIGH (ref 70–99)
Glucose-Capillary: 186 mg/dL — ABNORMAL HIGH (ref 70–99)

## 2020-10-17 LAB — BASIC METABOLIC PANEL
Anion gap: 8 (ref 5–15)
Anion gap: 4 — ABNORMAL LOW (ref 5–15)
BUN: 11 mg/dL (ref 6–20)
BUN: 13 mg/dL (ref 6–20)
CO2: 24 mmol/L (ref 22–32)
CO2: 27 mmol/L (ref 22–32)
Calcium: 7.8 mg/dL — ABNORMAL LOW (ref 8.9–10.3)
Calcium: 8.1 mg/dL — ABNORMAL LOW (ref 8.9–10.3)
Chloride: 110 mmol/L (ref 98–111)
Chloride: 106 mmol/L (ref 98–111)
Creatinine, Ser: 0.71 mg/dL (ref 0.44–1.00)
Creatinine, Ser: 0.79 mg/dL (ref 0.44–1.00)
GFR, Estimated: >60 mL/min (ref >60)
GFR, Estimated: >60 mL/min (ref >60)
Glucose, Bld: 115 mg/dL — ABNORMAL HIGH (ref 70–99)
Glucose, Bld: 155 mg/dL — ABNORMAL HIGH (ref 70–99)
Potassium: 4.2 mmol/L (ref 3.5–5.1)
Potassium: 4.1 mmol/L (ref 3.5–5.1)
Sodium: 142 mmol/L (ref 135–145)
Sodium: 137 mmol/L (ref 135–145)

## 2020-10-17 LAB — PREPARE PLATELET PHERESIS: Unit division: 0

## 2020-10-17 LAB — MAGNESIUM
Magnesium: 2.6 mg/dL — ABNORMAL HIGH (ref 1.7–2.4)
Magnesium: 2.3 mg/dL (ref 1.7–2.4)

## 2020-10-17 LAB — BPAM PLATELET PHERESIS
Blood Product Expiration Date: 202204172359
ISSUE DATE / TIME: 202204151429
Unit Type and Rh: 6200

## 2020-10-17 MED ORDER — LEVALBUTEROL HCL 0.63 MG/3ML IN NEBU
0.6300 mg | INHALATION_SOLUTION | Freq: Four times a day (QID) | RESPIRATORY_TRACT | Status: DC
  Administered 2020-10-17 (×2): 0.63 mg via RESPIRATORY_TRACT
  Filled 2020-10-17 (×3): qty 3

## 2020-10-17 MED ORDER — COLCHICINE 0.3 MG HALF TABLET
0.3000 mg | ORAL_TABLET | Freq: Two times a day (BID) | ORAL | Status: DC
  Administered 2020-10-17 – 2020-10-19 (×6): 0.3 mg via ORAL
  Filled 2020-10-17 (×9): qty 1

## 2020-10-17 MED ORDER — LEVALBUTEROL HCL 0.63 MG/3ML IN NEBU: 0.6300 mg | INHALATION_SOLUTION | Freq: Four times a day (QID) | RESPIRATORY_TRACT | Status: DC

## 2020-10-17 MED ORDER — CARVEDILOL 6.25 MG PO TABS
6.2500 mg | ORAL_TABLET | Freq: Two times a day (BID) | ORAL | Status: DC
  Administered 2020-10-17 – 2020-10-19 (×5): 6.25 mg via ORAL
  Filled 2020-10-17 (×6): qty 1

## 2020-10-17 MED ORDER — THIAMINE HCL 100 MG/ML IJ SOLN
Freq: Once | INTRAVENOUS | Status: AC
  Filled 2020-10-17: qty 1000

## 2020-10-17 NOTE — Progress Notes (Signed)
1 Day Post-Op Procedure(s) (LRB): CORONARY ARTERY BYPASS GRAFTING (CABG), ON PUMP, TIMES THREE, USING LEFT INTERNAL MAMMARY ARTERY AND RIGHT ENDOSCOPICALLY HARVESTED GREATER SAPHENOUS VEIN (N/A) TRANSESOPHAGEAL ECHOCARDIOGRAM (TEE) (N/A) Subjective: No complaints Objective: Vital signs in last 24 hours: Temp:  [96.44 F (35.8 C)-99.5 F (37.5 C)] 98.42 F (36.9 C) (04/16 0700) Pulse Rate:  [73-84] 80 (04/16 0700) Cardiac Rhythm: Atrial paced (04/16 0400) Resp:  [0-22] 22 (04/15 1715) BP: (99-145)/(75-102) 145/95 (04/16 0600) SpO2:  [94 %-100 %] 96 % (04/16 0700) Arterial Line BP: (89-148)/(49-74) 135/68 (04/16 0700) FiO2 (%):  [36 %-50 %] 36 % (04/15 1800)  Hemodynamic parameters for last 24 hours: PAP: (25-44)/(8-23) 42/22 CO:  [2.8 L/min-4.1 L/min] 3.3 L/min CI:  [1.7 L/min/m2-2.4 L/min/m2] 2 L/min/m2  Intake/Output from previous day: 04/15 0701 - 04/16 0700 In: 5575.2 [I.V.:3191.5; Blood:680; IV Piggyback:1703.7] Out: 2705 [Urine:1485; Blood:450; Chest Tube:770] Intake/Output this shift: No intake/output data recorded.  General appearance: alert and cooperative Neurologic: intact Heart: regular rate and rhythm, S1, S2 normal, no murmur, click, rub or gallop Lungs: clear to auscultation bilaterally Abdomen: soft, non-tender; bowel sounds normal; no masses,  no organomegaly Extremities: extremities normal, atraumatic, no cyanosis or edema Wound: c/d/i  Lab Results: Recent Labs    10/16/20 1959 10/17/20 0413  WBC 14.7* 15.3*  HGB 9.0* 8.6*  HCT 27.9* 27.1*  PLT 111* 119*   BMET:  Recent Labs    10/16/20 1959 10/17/20 0413  NA 142 142  K 3.6 4.2  CL 112* 110  CO2 26 24  GLUCOSE 146* 115*  BUN 12 11  CREATININE 0.64 0.71  CALCIUM 7.4* 7.8*    PT/INR:  Recent Labs    10/16/20 1414  LABPROT 17.2*  INR 1.4*   ABG    Component Value Date/Time   PHART 7.369 10/16/2020 1751   HCO3 23.1 10/16/2020 1751   TCO2 24 10/16/2020 1751   ACIDBASEDEF 2.0  10/16/2020 1751   O2SAT 93.0 10/16/2020 1751   CBG (last 3)  Recent Labs    10/17/20 0308 10/17/20 0500 10/17/20 0656  GLUCAP 111* 110* 119*    Assessment/Plan: S/P Procedure(s) (LRB): CORONARY ARTERY BYPASS GRAFTING (CABG), ON PUMP, TIMES THREE, USING LEFT INTERNAL MAMMARY ARTERY AND RIGHT ENDOSCOPICALLY HARVESTED GREATER SAPHENOUS VEIN (N/A) TRANSESOPHAGEAL ECHOCARDIOGRAM (TEE) (N/A) Mobilize  Remove swan Remove arterial line Pulm toilet   LOS: 5 days    April Bryant 10/17/2020

## 2020-10-17 NOTE — Progress Notes (Signed)
Progress Note  Patient Name: April Bryant Date of Encounter: 10/17/2020  [facility] Cardiologist: Christell Constant, MD   Subjective   S/p CABG with extubation late 10/16/20.  No complication notes.  Patient feels tired and needs and some chest pain but feels better than she thought she would.  Inpatient Medications    Scheduled Meds:  acetaminophen  1,000 mg Oral Q6H   Or   acetaminophen (TYLENOL) oral liquid 160 mg/5 mL  1,000 mg Per Tube Q6H   aspirin EC  325 mg Oral Daily   Or   aspirin  324 mg Per Tube Daily   atorvastatin  80 mg Oral Daily   bisacodyl  10 mg Oral Daily   Or   bisacodyl  10 mg Rectal Daily   Chlorhexidine Gluconate Cloth  6 each Topical Daily   docusate sodium  200 mg Oral Daily   metoCLOPramide (REGLAN) injection  10 mg Intravenous Q6H   metoprolol tartrate  12.5 mg Oral BID   Or   metoprolol tartrate  12.5 mg Per Tube BID   [START ON 10/18/2020] pantoprazole  40 mg Oral Daily   sodium chloride flush  3 mL Intravenous Q12H   sodium chloride flush  3 mL Intravenous Q12H   Continuous Infusions:  sodium chloride     sodium chloride 10 mL/hr at 10/17/20 0700   sodium chloride     sodium chloride     sodium chloride Stopped (10/13/20 1539)   albumin human     cefUROXime (ZINACEF)  IV Stopped (10/17/20 0341)   dexmedetomidine (PRECEDEX) IV infusion Stopped (10/16/20 1632)   insulin 0.7 mL/hr at 10/17/20 0700   lactated ringers     lactated ringers     lactated ringers     nitroGLYCERIN Stopped (10/16/20 1415)   phenylephrine (NEO-SYNEPHRINE) Adult infusion Stopped (10/16/20 1756)   PRN Meds: sodium chloride, sodium chloride, acetaminophen, albumin human, dextrose, fentaNYL (SUBLIMAZE) injection, lactated ringers, metoprolol tartrate, midazolam, nitroGLYCERIN, ondansetron (ZOFRAN) IV, sodium chloride flush, sodium chloride flush, traMADol   Vital Signs    Vitals:   10/17/20 0400 10/17/20 0500 10/17/20 0600 10/17/20 0700  BP: (!)  141/102 132/90 (!) 145/95   Pulse: 73 76 84 80  Resp:      Temp: 99.14 F (37.3 C) 99.32 F (37.4 C) 99.5 F (37.5 C) 98.42 F (36.9 C)  TempSrc: Core     SpO2: 94% 97% 94% 96%  Weight:      Height:        Intake/Output Summary (Last 24 hours) at 10/17/2020 0737 Last data filed at 10/17/2020 0700 Gross per 24 hour  Intake 5575.2 ml  Output 2705 ml  Net 2870.2 ml   Last 3 Weights 10/16/2020 10/15/2020 10/14/2020  Weight (lbs) 142 lb 12.8 oz 144 lb 142 lb 1.6 oz  Weight (kg) 64.774 kg 65.318 kg 64.456 kg      Telemetry    SR with som A pacing - Personally Reviewed  ECG    SR rate 76 with one a paced beat; ST elevation in V2 and V3 without diffuse TWI,  - Personally Reviewed  Physical Exam   GEN: No acute distress.  Fatigued Neck: R IV Swan c/d/i Cardiac: RRR, no murmurs, rubs, or gallops Respiratory: Decreased breath sounds bilaterally in the bases GI: Soft, nontender, non-distended  MS: No edema; No deformity. Neuro:  Nonfocal  Psych: Normal affect  Lines:  A Line c/d/I, SBP 150; Pacing wires presenty but presently in native  rhythm Chest Tube X2 present  Labs    High Sensitivity Troponin:   Recent Labs  Lab 10/12/20 1631 10/12/20 1737 10/12/20 1928 10/12/20 2212  TROPONINIHS 1,577* 3,345* 7,755* 9,893*      Chemistry Recent Labs  Lab 10/14/20 0232 10/15/20 0306 10/16/20 0405 10/16/20 0834 10/16/20 1207 10/16/20 1212 10/16/20 1751 10/16/20 1959 10/17/20 0413  NA 139   < > 140   < > 142   < > 148* 142 142  K 3.5   < > 4.2   < > 4.4   < > 3.3* 3.6 4.2  CL 110   < > 109   < > 107  --   --  112* 110  CO2 22   < > 22  --   --   --   --  26 24  GLUCOSE 110*   < > 111*   < > 126*  --   --  146* 115*  BUN 12   < > 15   < > 12  --   --  12 11  CREATININE 0.70   < > 0.71   < > 0.70  --   --  0.64 0.71  CALCIUM 8.9   < > 9.2  --   --   --   --  7.4* 7.8*  PROT 5.8*  --   --   --   --   --   --   --   --   ALBUMIN 3.6  --   --   --   --   --   --   --    --   AST 37  --   --   --   --   --   --   --   --   ALT 21  --   --   --   --   --   --   --   --   ALKPHOS 60  --   --   --   --   --   --   --   --   BILITOT 0.7  --   --   --   --   --   --   --   --   GFRNONAA >60   < > >60  --   --   --   --  >60 >60  ANIONGAP 7   < > 9  --   --   --   --  4* 8   < > = values in this interval not displayed.     Hematology Recent Labs  Lab 10/16/20 1414 10/16/20 1417 10/16/20 1751 10/16/20 1959 10/17/20 0413  WBC 13.7*  --   --  14.7* 15.3*  RBC 3.69*  --   --  3.61* 3.47*  HGB 9.1*   < > 8.8* 9.0* 8.6*  HCT 29.0*   < > 26.0* 27.9* 27.1*  MCV 78.6*  --   --  77.3* 78.1*  MCH 24.7*  --   --  24.9* 24.8*  MCHC 31.4  --   --  32.3 31.7  RDW 14.9  --   --  14.9 15.3  PLT 74*  --   --  111* 119*   < > = values in this interval not displayed.    BNPNo results for input(s): BNP, PROBNP in the last 168 hours.   DDimer No results for input(s): DDIMER in the last 168  hours.   Radiology    DG Chest 2 View  Result Date: 10/15/2020 CLINICAL DATA:  Coronary artery disease post MI, pre CABG assessment. History hypertension, rheumatoid arthritis EXAM: CHEST - 2 VIEW COMPARISON:  10/12/2020 FINDINGS: Normal heart size, mediastinal contours, and pulmonary vascularity. Lungs clear. No acute infiltrate, pleural effusion or pneumothorax. Biconvex thoracic scoliosis. Slight osseous demineralization. IMPRESSION: No acute abnormalities. Electronically Signed   By: Ulyses Southward M.D.   On: 10/15/2020 10:32   DG CHEST PORT 1 VIEW  Result Date: 10/16/2020 CLINICAL DATA:  AP CXR (1V) for Atelectasis of Lt AND Rt lung. Postop CABG on 10/16/2020. EXAM: PORTABLE CHEST 1 VIEW COMPARISON:  10/16/2020 at 2:04 p.m. FINDINGS: Cardiac silhouette remains normal in size. There is no mediastinal widening. Previously noted atelectasis in the right upper lobe has resolved. There are linear areas of atelectasis in the mid and lower lungs, lower lung atelectasis or apparent than on  the earlier study. No evidence of pulmonary edema. Probable small effusions. No pneumothorax. Well-positioned endotracheal tube, currently projecting 2 cm above the carina. Right internal jugular Swan-Ganz catheter, nasal/orogastric tube, mediastinal tubes and bilateral chest tubes are stable. IMPRESSION: 1. Resolved right upper lobe atelectasis. 2. Mild mid and lower lung zone linear atelectasis. 3. No evidence of pulmonary edema, no pneumothorax and no mediastinal widening. 4. Well-positioned lines and tubes. Electronically Signed   By: Amie Portland M.D.   On: 10/16/2020 16:21   DG Chest Port 1 View  Result Date: 10/16/2020 CLINICAL DATA:  Post CABG x 3 EXAM: PORTABLE CHEST 1 VIEW COMPARISON:  Portable exam 1404 hours compared to 10/16/2020 FINDINGS: Tip of endotracheal tube projects 1.3 cm above carina. Nasogastric tube extends into stomach. RIGHT jugular Swan-Ganz catheter tip projecting over LEFT pulmonary artery. Mediastinal drains and BILATERAL thoracostomy tubes. Epicardial pacing wires. Upper normal size of cardiac silhouette post CABG. Mediastinal contours and pulmonary vascularity normal. Atelectasis in RIGHT upper lobe with subsegmental atelectasis LEFT mid lung. Mild atelectasis versus infiltrate in LEFT lower lobe. Remaining lungs clear. No pleural effusion or pneumothorax. Osseous structures unremarkable. IMPRESSION: Postsurgical lines and tubes as above. Atelectasis RIGHT upper lobe and LEFT mid lung with additional atelectasis versus infiltrate in retrocardiac LEFT lower lobe. Electronically Signed   By: Ulyses Southward M.D.   On: 10/16/2020 14:25   DG Chest Port 1V same Day  Result Date: 10/16/2020 CLINICAL DATA:  Post CABG.  Assess for retained foreign body. EXAM: PORTABLE CHEST 1 VIEW COMPARISON:  10/15/2020 FINDINGS: Endotracheal tube has tip 2.6 cm above the carina. Right IJ Swan-Ganz catheter has tip over the right main pulmonary artery. Bilateral chest tubes as well as mediastinal  drain/pericardial drains in place. Lungs are adequately inflated demonstrate focal opacification over the left midlung likely atelectasis. There is subtle hazy prominence of the perihilar vessels likely mild vascular congestion. No significant effusion or pneumothorax. Cardiomediastinal silhouette and remainder the exam is unchanged. No evidence of radiopaque foreign body to suggest retained needles. IMPRESSION: 1. Mild focal opacification left midlung likely atelectasis. Suggestion of mild vascular congestion. 2. Multiple tubes and lines as described. No evidence of retained needles. Electronically Signed   By: Elberta Fortis M.D.   On: 10/16/2020 13:22   ECHO INTRAOPERATIVE TEE  Result Date: 10/16/2020  *INTRAOPERATIVE TRANSESOPHAGEAL REPORT *  Patient Name:   April Bryant Date of Exam: 10/16/2020 Medical Rec #:  [id number]        Height:       63.0 in Accession #:  [id number]       Weight:       142.8 lb Date of Birth:  1961/04/11        BSA:          1.68 m Patient Age:    60 years         BP:           185/105 mmHg Patient Gender: F                HR:           76 bpm. Exam Location:  Anesthesiology Transesophogeal exam was perform intraoperatively during surgical procedure. Patient was closely monitored under general anesthesia during the entirety of examination. Indications:     Coronary Artery Disease Sonographer:     Eulah Pont RDCS Performing Phys: Leslye Peer MD Diagnosing Phys: Leslye Peer MD Complications: No known complications during this procedure. POST-OP IMPRESSIONS - Left Ventricle: The left ventricle is unchanged from pre-bypass. - Right Ventricle: The right ventricle appears unchanged from pre-bypass. - Aorta: The aorta appears unchanged from pre-bypass. - Left Atrium: The left atrium appears unchanged from pre-bypass. - Left Atrial Appendage: The left atrial appendage appears unchanged from pre-bypass. - Aortic Valve: The aortic valve appears unchanged from pre-bypass. - Mitral  Valve: The mitral valve appears unchanged from pre-bypass. - Tricuspid Valve: The tricuspid valve appears unchanged from pre-bypass. - Interatrial Septum: The interatrial septum appears unchanged from pre-bypass. - Interventricular Septum: The interventricular septum appears unchanged from pre-bypass. - Pericardium: The pericardium appears unchanged from pre-bypass. PRE-OP FINDINGS  Left Ventricle: The left ventricle has normal systolic function, with an ejection fraction of 55-60%. The cavity size was normal. There is no increase in left ventricular wall thickness. There is no left ventricular hypertrophy. Right Ventricle: The right ventricle has normal systolic function. The cavity was normal. There is no increase in right ventricular wall thickness. Left Atrium: Left atrial size was dilated. No left atrial/left atrial appendage thrombus was detected. Right Atrium: Right atrial size was normal in size. Interatrial Septum: No atrial level shunt detected by color flow Doppler. Pericardium: There is no evidence of pericardial effusion. Mitral Valve: The mitral valve is normal in structure. Mitral valve regurgitation is mild by color flow Doppler. There is No evidence of mitral stenosis. Tricuspid Valve: The tricuspid valve was normal in structure. Tricuspid valve regurgitation was not visualized by color flow Doppler. Aortic Valve: The aortic valve is tricuspid Aortic valve regurgitation was not visualized by color flow Doppler. There is no stenosis of the aortic valve. Pulmonic Valve: The pulmonic valve was not assessed. Pulmonic valve regurgitation was not assessed by color flow Doppler. Aorta: The aortic root, ascending aorta and aortic arch are normal in size and structure.  Leslye Peer MD Electronically signed by Leslye Peer MD Signature Date/Time: 10/16/2020/12:04:47 PM    Final     Cardiac Studies     LHC 10/13/20: Culprit lesion is a large branching ramus intermedius and has ostial to proximal  narrowing without landing zone for PCI. Left main is widely patent LAD proximal to mid segment with intermediate stenosis in the 65 to 75% range documented to be hemodynamically significant by both RFR (0.82) and FFR (0.72). Circumflex is relatively small.  There is a moderate-sized third obtuse marginal.  The mid vessel contains eccentric 50 to 60% narrowing. RCA is dominant.  There is significant tortuosity.  The distal RCA before the PDA contains segmental 50% narrowing.  The ostial to proximal PDA  is 60-70% narrowed. Overall LV function is normal with EF 60%.   RECOMMENDATIONS:   TCTS consultation. There is three-vessel coronary artery disease in this patient who smokes, has significant hypertension, is prediabetic, and hyperlipidemia. PCI on ramus intermedius would require overhang of stent into the distal left main.       Echocardiogram 10/13/20:  1. Left ventricular ejection fraction, by estimation, is 55 to 60%. The  left ventricle has normal function. The left ventricle has no regional  wall motion abnormalities. Left ventricular diastolic parameters are  consistent with Grade II diastolic  dysfunction (pseudonormalization).   2. Right ventricular systolic function is normal. The right ventricular  size is normal. There is moderately elevated pulmonary artery systolic  pressure.   3. Left atrial size was mild to moderately dilated.   4. The mitral valve is normal in structure. Moderate mitral valve  regurgitation. No evidence of mitral stenosis.   5. The aortic valve is normal in structure. Aortic valve regurgitation is  not visualized. No aortic stenosis is present.   Patient Profile   60 y.o. female with a PMH of HTN, RA, and tobacco abuse, who is being followed by cardiology for NSTEMI s/p CABG  Assessment & Plan    NSTEMI s/p CABG HTN HLD Mild MR Tobacco Abuse -continue ASA 325  continue atorvastatin 80 mg PO Daily - Would recommend Coreg > BB given considerable  increase in BP through care (ordered) - Consider low dose lasix - smoking cessation, OK for nictotine patch if needed  For questions or updates, please contact [facility] Please consult [email or web address] for contact info under        Signed, Christell Constant, MD  10/17/2020, 7:37 AM

## 2020-10-17 NOTE — Progress Notes (Signed)
TCTS Evening Rounds  POD #1 s/p CABG Doing well No complaints; just walked BP 118/81 (BP Location: Left Arm)   Pulse 79   Temp 97.8 F (36.6 C) (Oral)   Resp 18   Ht 5\' 3"  (1.6 m)   Wt 64.8 kg   SpO2 94%   BMI 25.30 kg/m  Alert/oriented CTA RRR Mild edema   Intake/Output Summary (Last 24 hours) at 10/17/2020 1922 Last data filed at 10/17/2020 1800 Gross per 24 hour  Intake 1517.94 ml  Output 1840 ml  Net -322.06 ml    A/p:  Pain control pulm toilet Diuresis tomorrow April Jetter Z. 10/19/2020, MD [phone]

## 2020-10-18 ENCOUNTER — Inpatient Hospital Stay (HOSPITAL_COMMUNITY): Payer: 59

## 2020-10-18 LAB — BASIC METABOLIC PANEL
Anion gap: 6 (ref 5–15)
BUN: 17 mg/dL (ref 6–20)
CO2: 27 mmol/L (ref 22–32)
Calcium: 8.1 mg/dL — ABNORMAL LOW (ref 8.9–10.3)
Chloride: 101 mmol/L (ref 98–111)
Creatinine, Ser: 0.83 mg/dL (ref 0.44–1.00)
GFR, Estimated: >60 mL/min (ref >60)
Glucose, Bld: 139 mg/dL — ABNORMAL HIGH (ref 70–99)
Potassium: 4.6 mmol/L (ref 3.5–5.1)
Sodium: 134 mmol/L — ABNORMAL LOW (ref 135–145)

## 2020-10-18 LAB — CBC
HCT: 26.5 % — ABNORMAL LOW (ref 36.0–46.0)
Hemoglobin: 8.2 g/dL — ABNORMAL LOW (ref 12.0–15.0)
MCH: 25 pg — ABNORMAL LOW (ref 26.0–34.0)
MCHC: 30.9 g/dL (ref 30.0–36.0)
MCV: 80.8 fL (ref 80.0–100.0)
Platelets: 106 10*3/uL — ABNORMAL LOW (ref 150–400)
RBC: 3.28 MIL/uL — ABNORMAL LOW (ref 3.87–5.11)
RDW: 15.4 % (ref 11.5–15.5)
WBC: 11.5 10*3/uL — ABNORMAL HIGH (ref 4.0–10.5)
nRBC: 0.2 % (ref 0.0–0.2)

## 2020-10-18 LAB — GLUCOSE, CAPILLARY
Glucose-Capillary: 137 mg/dL — ABNORMAL HIGH (ref 70–99)
Glucose-Capillary: 165 mg/dL — ABNORMAL HIGH (ref 70–99)
Glucose-Capillary: 142 mg/dL — ABNORMAL HIGH (ref 70–99)
Glucose-Capillary: 129 mg/dL — ABNORMAL HIGH (ref 70–99)

## 2020-10-18 MED ORDER — FUROSEMIDE 10 MG/ML IJ SOLN
20.0000 mg | Freq: Once | INTRAMUSCULAR | Status: AC
  Administered 2020-10-18: 20 mg via INTRAVENOUS
  Filled 2020-10-18: qty 2

## 2020-10-18 MED ORDER — FUROSEMIDE 10 MG/ML IJ SOLN
40.0000 mg | Freq: Two times a day (BID) | INTRAMUSCULAR | Status: DC
  Administered 2020-10-18 – 2020-10-19 (×4): 40 mg via INTRAVENOUS
  Filled 2020-10-18 (×3): qty 4

## 2020-10-18 MED ORDER — POTASSIUM CHLORIDE CRYS ER 20 MEQ PO TBCR
20.0000 meq | EXTENDED_RELEASE_TABLET | Freq: Two times a day (BID) | ORAL | Status: DC
  Administered 2020-10-18 – 2020-10-20 (×5): 20 meq via ORAL
  Filled 2020-10-18 (×5): qty 1

## 2020-10-18 MED ORDER — LEVALBUTEROL HCL 0.63 MG/3ML IN NEBU: 0.6300 mg | INHALATION_SOLUTION | Freq: Four times a day (QID) | RESPIRATORY_TRACT | Status: DC | PRN

## 2020-10-18 NOTE — Progress Notes (Signed)
Mobility Specialist: Progress Note   10/18/20 1458  Mobility  Activity Ambulated in hall  Level of Assistance Minimal assist, patient does 75% or more  Assistive Device Front wheel walker  Distance Ambulated (ft) 470 ft  Mobility Response Tolerated well  Mobility performed by Mobility specialist  $Mobility charge 1 Mobility   Pre-Mobility: 80 HR, 96% SpO2 During-Mobility:           On 3 L/min [place]: 100% SpO2           On 1 L/min [place]: 95% Post-Mobility: 84 HR, 133/76 BP, 94% SpO2  Pt began ambulation on 3 L/min [place]. Pt weaned down to RA during walk but unable to get reading on SpO2 until end of ambulation. Pt sats read 80% with good pleth on RA after returning to room. Pt said she felt a little SOB but not too bad. Pt placed on 2 L/min [place] to recover with sats reading 85% with good pleth. O2 flow increased back to 3 L/min and educated pt on pursed lip breathing. Sats are now maintaining 93-95% on 3 L with good pleth. Encouraged more walks and IS use.   [facility] Blakeleigh Domek Mobility Specialist Mobility Specialist Phone: [phone]

## 2020-10-18 NOTE — Progress Notes (Signed)
2 Days Post-Op Procedure(s) (LRB): CORONARY ARTERY BYPASS GRAFTING (CABG), ON PUMP, TIMES THREE, USING LEFT INTERNAL MAMMARY ARTERY AND RIGHT ENDOSCOPICALLY HARVESTED GREATER SAPHENOUS VEIN (N/A) TRANSESOPHAGEAL ECHOCARDIOGRAM (TEE) (N/A) Subjective: No complaints  Objective: Vital signs in last 24 hours: Temp:  [81.14 F (27.3 C)-98.8 F (37.1 C)] 98 F (36.7 C) (04/17 0736) Pulse Rate:  [66-86] 77 (04/17 0600) Cardiac Rhythm: Normal sinus rhythm (04/17 0000) Resp:  [18] 18 (04/16 1800) BP: (93-133)/(76-109) 133/82 (04/17 0600) SpO2:  [76 %-97 %] 94 % (04/17 0600) Arterial Line BP: (119-142)/(67-78) 119/78 (04/16 1015) FiO2 (%):  [32 %] 32 % (04/16 1512) Weight:  [72.7 kg] 72.7 kg (04/17 0500)  Hemodynamic parameters for last 24 hours: PAP: (37-42)/(11-19) 40/11  Intake/Output from previous day: 04/16 0701 - 04/17 0700 In: 729.7 [P.O.:480; I.V.:49.7; IV Piggyback:200] Out: 1480 [Urine:570; Chest Tube:910] Intake/Output this shift: Total I/O In: -  Out: 70 [Urine:70]  General appearance: alert and cooperative Neurologic: intact Heart: regular rate and rhythm, S1, S2 normal, no murmur, click, rub or gallop Lungs: rales bibasilar Abdomen: soft, non-tender; bowel sounds normal; no masses,  no organomegaly Extremities: edema 2+ Wound: dressed, dry  Lab Results: Recent Labs    10/17/20 1713 10/18/20 0345  WBC 16.1* 11.5*  HGB 8.7* 8.2*  HCT 28.0* 26.5*  PLT 130* 106*   BMET:  Recent Labs    10/17/20 1713 10/18/20 0345  NA 137 134*  K 4.1 4.6  CL 106 101  CO2 27 27  GLUCOSE 155* 139*  BUN 13 17  CREATININE 0.79 0.83  CALCIUM 8.1* 8.1*    PT/INR:  Recent Labs    10/16/20 1414  LABPROT 17.2*  INR 1.4*   ABG    Component Value Date/Time   PHART 7.336 (L) 10/16/2020 1914   HCO3 26.4 10/16/2020 1914   TCO2 28 10/16/2020 1914   ACIDBASEDEF 2.0 10/16/2020 1751   O2SAT 95.0 10/16/2020 1914   CBG (last 3)  Recent Labs    10/17/20 2345  10/18/20 0414 10/18/20 0652  GLUCAP 149* 137* 165*    Assessment/Plan: S/P Procedure(s) (LRB): CORONARY ARTERY BYPASS GRAFTING (CABG), ON PUMP, TIMES THREE, USING LEFT INTERNAL MAMMARY ARTERY AND RIGHT ENDOSCOPICALLY HARVESTED GREATER SAPHENOUS VEIN (N/A) TRANSESOPHAGEAL ECHOCARDIOGRAM (TEE) (N/A) Mobilize Diuresis Plan for transfer to step-down: see transfer orders   LOS: 6 days    Linden Dolin 10/18/2020

## 2020-10-18 NOTE — Progress Notes (Signed)
Progress Note  Patient Name: April Bryant Date of Encounter: 10/18/2020  [facility] Cardiologist: Christell Constant, MD   Subjective   No events overnight.  No pacing needed this AM.  Just received Fentanyl for pain (chest tube) and feels a bit loopy.  Inpatient Medications    Scheduled Meds:  acetaminophen  1,000 mg Oral Q6H   Or   acetaminophen (TYLENOL) oral liquid 160 mg/5 mL  1,000 mg Per Tube Q6H   aspirin EC  325 mg Oral Daily   Or   aspirin  324 mg Per Tube Daily   atorvastatin  80 mg Oral Daily   bisacodyl  10 mg Oral Daily   Or   bisacodyl  10 mg Rectal Daily   carvedilol  6.25 mg Oral BID WC   Chlorhexidine Gluconate Cloth  6 each Topical Daily   colchicine  0.3 mg Oral BID   docusate sodium  200 mg Oral Daily   levalbuterol  0.63 mg Inhalation Q6H   pantoprazole  40 mg Oral Daily   sodium chloride flush  3 mL Intravenous Q12H   sodium chloride flush  3 mL Intravenous Q12H   Continuous Infusions:  sodium chloride     sodium chloride Stopped (10/17/20 1023)   sodium chloride     sodium chloride Stopped (10/18/20 0341)   sodium chloride Stopped (10/13/20 1539)   insulin Stopped (10/17/20 1008)   lactated ringers     lactated ringers     lactated ringers     nitroGLYCERIN Stopped (10/16/20 1415)   phenylephrine (NEO-SYNEPHRINE) Adult infusion Stopped (10/16/20 1756)   PRN Meds: sodium chloride, sodium chloride, acetaminophen, dextrose, fentaNYL (SUBLIMAZE) injection, lactated ringers, metoprolol tartrate, ondansetron (ZOFRAN) IV, sodium chloride flush, sodium chloride flush, traMADol   Vital Signs    Vitals:   10/18/20 0418 10/18/20 0500 10/18/20 0600 10/18/20 0736  BP:  133/76 133/82   Pulse:  75 77   Resp:      Temp: 98.8 F (37.1 C)   98 F (36.7 C)  TempSrc: Oral     SpO2:  93% 94%   Weight:  72.7 kg    Height:        Intake/Output Summary (Last 24 hours) at 10/18/2020 0736 Last data filed at 10/18/2020 0600 Gross per 24  hour  Intake 729.68 ml  Output 1350 ml  Net -620.32 ml   Last 3 Weights 10/18/2020 10/16/2020 10/15/2020  Weight (lbs) 160 lb 4.4 oz 142 lb 12.8 oz 144 lb  Weight (kg) 72.7 kg 64.774 kg 65.318 kg      Telemetry    SR to som A pacing - Personally Reviewed  ECG    No new last 24  - Personally Reviewed  Physical Exam   GEN: No acute distress.  Fatigued Neck: R IV Swan c/d/i Cardiac: RRR, no murmurs, rubs, or gallops Respiratory: Decreased breath sounds bilaterally in the bases GI: Soft, nontender, non-distended  MS: No edema; No deformity. Neuro:  Nonfocal  Psych: Normal affect  Chest Tube X2 present   Labs    High Sensitivity Troponin:   Recent Labs  Lab 10/12/20 1631 10/12/20 1737 10/12/20 1928 10/12/20 2212  TROPONINIHS 1,577* 3,345* 7,755* 9,893*      Chemistry Recent Labs  Lab 10/14/20 0232 10/15/20 0306 10/17/20 0413 10/17/20 1713 10/18/20 0345  NA 139   < > 142 137 134*  K 3.5   < > 4.2 4.1 4.6  CL 110   < > 110  106 101  CO2 22   < > GLUCOSE 110*   < > 115* 155* 139*  BUN 12   < > CREATININE 0.70   < > 0.71 0.79 0.83  CALCIUM 8.9   < > 7.8* 8.1* 8.1*  PROT 5.8*  --   --   --   --   ALBUMIN 3.6  --   --   --   --   AST 37  --   --   --   --   ALT 21  --   --   --   --   ALKPHOS 60  --   --   --   --   BILITOT 0.7  --   --   --   --   GFRNONAA >60   < > >60 >60 >60  ANIONGAP 7   < > 8 4* 6   < > = values in this interval not displayed.     Hematology Recent Labs  Lab 10/17/20 0413 10/17/20 1713 10/18/20 0345  WBC 15.3* 16.1* 11.5*  RBC 3.47* 3.53* 3.28*  HGB 8.6* 8.7* 8.2*  HCT 27.1* 28.0* 26.5*  MCV 78.1* 79.3* 80.8  MCH 24.8* 24.6* 25.0*  MCHC 31.7 31.1 30.9  RDW 15.3 15.4 15.4  PLT 119* 130* 106*    BNPNo results for input(s): BNP, PROBNP in the last 168 hours.   DDimer No results for input(s): DDIMER in the last 168 hours.   Radiology    DG Chest Port 1 View  Result Date: 10/17/2020 CLINICAL DATA:   Status post CABG x3.  History of hypertension. EXAM: PORTABLE CHEST 1 VIEW COMPARISON:  Chest x-ray dated 10/16/2020. FINDINGS: Endotracheal tube and nasogastric tube have been removed. Swan-Ganz catheter is stable in position with tip just to the RIGHT of midline. Mediastinal drain remains in place. Bilateral chest tubes appear stable in position. Stable mild bibasilar atelectasis and/or small pleural effusions. Lungs otherwise clear. No pneumothorax is seen. IMPRESSION: 1. Endotracheal tube and nasogastric tube have been removed. 2. Remaining support apparatus appears stable in position. 3. Stable mild bibasilar atelectasis and/or small pleural effusions. Lungs otherwise clear. No pneumothorax seen. Electronically Signed   By: Bary Richard M.D.   On: 10/17/2020 08:13   DG CHEST PORT 1 VIEW  Result Date: 10/16/2020 CLINICAL DATA:  AP CXR (1V) for Atelectasis of Lt AND Rt lung. Postop CABG on 10/16/2020. EXAM: PORTABLE CHEST 1 VIEW COMPARISON:  10/16/2020 at 2:04 p.m. FINDINGS: Cardiac silhouette remains normal in size. There is no mediastinal widening. Previously noted atelectasis in the right upper lobe has resolved. There are linear areas of atelectasis in the mid and lower lungs, lower lung atelectasis or apparent than on the earlier study. No evidence of pulmonary edema. Probable small effusions. No pneumothorax. Well-positioned endotracheal tube, currently projecting 2 cm above the carina. Right internal jugular Swan-Ganz catheter, nasal/orogastric tube, mediastinal tubes and bilateral chest tubes are stable. IMPRESSION: 1. Resolved right upper lobe atelectasis. 2. Mild mid and lower lung zone linear atelectasis. 3. No evidence of pulmonary edema, no pneumothorax and no mediastinal widening. 4. Well-positioned lines and tubes. Electronically Signed   By: Amie Portland M.D.   On: 10/16/2020 16:21   DG Chest Port 1 View  Result Date: 10/16/2020 CLINICAL DATA:  Post CABG x 3 EXAM: PORTABLE CHEST 1 VIEW  COMPARISON:  Portable exam 1404 hours compared to 10/16/2020 FINDINGS: Tip of endotracheal tube projects 1.3  cm above carina. Nasogastric tube extends into stomach. RIGHT jugular Swan-Ganz catheter tip projecting over LEFT pulmonary artery. Mediastinal drains and BILATERAL thoracostomy tubes. Epicardial pacing wires. Upper normal size of cardiac silhouette post CABG. Mediastinal contours and pulmonary vascularity normal. Atelectasis in RIGHT upper lobe with subsegmental atelectasis LEFT mid lung. Mild atelectasis versus infiltrate in LEFT lower lobe. Remaining lungs clear. No pleural effusion or pneumothorax. Osseous structures unremarkable. IMPRESSION: Postsurgical lines and tubes as above. Atelectasis RIGHT upper lobe and LEFT mid lung with additional atelectasis versus infiltrate in retrocardiac LEFT lower lobe. Electronically Signed   By: Ulyses Southward M.D.   On: 10/16/2020 14:25   DG Chest Port 1V same Day  Result Date: 10/16/2020 CLINICAL DATA:  Post CABG.  Assess for retained foreign body. EXAM: PORTABLE CHEST 1 VIEW COMPARISON:  10/15/2020 FINDINGS: Endotracheal tube has tip 2.6 cm above the carina. Right IJ Swan-Ganz catheter has tip over the right main pulmonary artery. Bilateral chest tubes as well as mediastinal drain/pericardial drains in place. Lungs are adequately inflated demonstrate focal opacification over the left midlung likely atelectasis. There is subtle hazy prominence of the perihilar vessels likely mild vascular congestion. No significant effusion or pneumothorax. Cardiomediastinal silhouette and remainder the exam is unchanged. No evidence of radiopaque foreign body to suggest retained needles. IMPRESSION: 1. Mild focal opacification left midlung likely atelectasis. Suggestion of mild vascular congestion. 2. Multiple tubes and lines as described. No evidence of retained needles. Electronically Signed   By: Elberta Fortis M.D.   On: 10/16/2020 13:22    Cardiac Studies     LHC  10/13/20: Culprit lesion is a large branching ramus intermedius and has ostial to proximal narrowing without landing zone for PCI. Left main is widely patent LAD proximal to mid segment with intermediate stenosis in the 65 to 75% range documented to be hemodynamically significant by both RFR (0.82) and FFR (0.72). Circumflex is relatively small.  There is a moderate-sized third obtuse marginal.  The mid vessel contains eccentric 50 to 60% narrowing. RCA is dominant.  There is significant tortuosity.  The distal RCA before the PDA contains segmental 50% narrowing.  The ostial to proximal PDA is 60-70% narrowed. Overall LV function is normal with EF 60%.   RECOMMENDATIONS:   TCTS consultation. There is three-vessel coronary artery disease in this patient who smokes, has significant hypertension, is prediabetic, and hyperlipidemia. PCI on ramus intermedius would require overhang of stent into the distal left main.       Echocardiogram 10/13/20:  1. Left ventricular ejection fraction, by estimation, is 55 to 60%. The  left ventricle has normal function. The left ventricle has no regional  wall motion abnormalities. Left ventricular diastolic parameters are  consistent with Grade II diastolic  dysfunction (pseudonormalization).   2. Right ventricular systolic function is normal. The right ventricular  size is normal. There is moderately elevated pulmonary artery systolic  pressure.   3. Left atrial size was mild to moderately dilated.   4. The mitral valve is normal in structure. Moderate mitral valve  regurgitation. No evidence of mitral stenosis.   5. The aortic valve is normal in structure. Aortic valve regurgitation is  not visualized. No aortic stenosis is present.   Patient Profile   60 y.o. female with a PMH of HTN, RA, and tobacco abuse, who is being followed by cardiology for NSTEMI s/p CABG  Assessment & Plan    NSTEMI s/p CABG HTN HLD Mild MR Tobacco Abuse -continue ASA  325  continue atorvastatin 80 mg PO Daily - Continue Coreg; BP has improved - 20 lasix IV X1 (ordered) - smoking cessation, OK for nictotine patch if needed; Wife has taken all tobacco products out of house today - discussed heart healthy diet (AHA Diet and Feeding [place] Recipes given)  Time Spent Directly with Patient:   I have spent a total of 35 minutes with the patient reviewing notes, imaging, EKGs, labs and examining the patient as well as establishing an assessment and plan that was discussed personally with the patient.  > 50% of time was spent in direct patient care and family.  Discussed with nurse who had no additional concerns.  Patient plans to follow with me at discharge.   For questions or updates, please contact [facility] Please consult [email or web address] for contact info under        Signed, Christell Constant, MD  10/18/2020, 7:36 AM

## 2020-10-18 NOTE — Progress Notes (Signed)
Pt arrived from unit from 2heart. VSS.Will continue to monitor Pacing wires taped. Pt. Oriented to unit CHG bath given, A/Ox4   Everlean Cherry, RN

## 2020-10-19 ENCOUNTER — Encounter (HOSPITAL_COMMUNITY): Payer: Self-pay | Admitting: Thoracic Surgery (Cardiothoracic Vascular Surgery)

## 2020-10-19 ENCOUNTER — Inpatient Hospital Stay (HOSPITAL_COMMUNITY): Payer: 59

## 2020-10-19 LAB — BASIC METABOLIC PANEL
Anion gap: 8 (ref 5–15)
BUN: 12 mg/dL (ref 6–20)
CO2: 28 mmol/L (ref 22–32)
Calcium: 7.9 mg/dL — ABNORMAL LOW (ref 8.9–10.3)
Chloride: 100 mmol/L (ref 98–111)
Creatinine, Ser: 0.68 mg/dL (ref 0.44–1.00)
GFR, Estimated: >60 mL/min (ref >60)
Glucose, Bld: 127 mg/dL — ABNORMAL HIGH (ref 70–99)
Potassium: 4.3 mmol/L (ref 3.5–5.1)
Sodium: 136 mmol/L (ref 135–145)

## 2020-10-19 LAB — CBC
HCT: 25.5 % — ABNORMAL LOW (ref 36.0–46.0)
Hemoglobin: 8.1 g/dL — ABNORMAL LOW (ref 12.0–15.0)
MCH: 24.8 pg — ABNORMAL LOW (ref 26.0–34.0)
MCHC: 31.8 g/dL (ref 30.0–36.0)
MCV: 78.2 fL — ABNORMAL LOW (ref 80.0–100.0)
Platelets: 108 10*3/uL — ABNORMAL LOW (ref 150–400)
RBC: 3.26 MIL/uL — ABNORMAL LOW (ref 3.87–5.11)
RDW: 14.8 % (ref 11.5–15.5)
WBC: 12 10*3/uL — ABNORMAL HIGH (ref 4.0–10.5)
nRBC: 0.4 % — ABNORMAL HIGH (ref 0.0–0.2)

## 2020-10-19 LAB — GLUCOSE, CAPILLARY
Glucose-Capillary: 137 mg/dL — ABNORMAL HIGH (ref 70–99)
Glucose-Capillary: 121 mg/dL — ABNORMAL HIGH (ref 70–99)

## 2020-10-19 MED ORDER — ENOXAPARIN SODIUM 40 MG/0.4ML ~~LOC~~ SOLN
40.0000 mg | SUBCUTANEOUS | Status: DC
  Administered 2020-10-19: 40 mg via SUBCUTANEOUS
  Filled 2020-10-19: qty 0.4

## 2020-10-19 MED ORDER — ENOXAPARIN SODIUM 40 MG/0.4ML ~~LOC~~ SOLN: 40.0000 mg | Freq: Two times a day (BID) | SUBCUTANEOUS | Status: DC

## 2020-10-19 MED ORDER — PHENOL 1.4 % MT LIQD: 2.0000 | OROMUCOSAL | Status: DC | PRN

## 2020-10-19 MED ORDER — INSULIN ASPART 100 UNIT/ML ~~LOC~~ SOLN
0.0000 [IU] | Freq: Three times a day (TID) | SUBCUTANEOUS | Status: DC
  Administered 2020-10-19: 2 [IU] via SUBCUTANEOUS

## 2020-10-19 MED FILL — Heparin Sodium (Porcine) Inj 1000 Unit/ML: INTRAMUSCULAR | Qty: 30 | Status: AC

## 2020-10-19 MED FILL — Potassium Chloride Inj 2 mEq/ML: INTRAVENOUS | Qty: 40 | Status: AC

## 2020-10-19 MED FILL — Magnesium Sulfate Inj 50%: INTRAMUSCULAR | Qty: 10 | Status: AC

## 2020-10-19 NOTE — Progress Notes (Signed)
Mobility Specialist - Progress Note   10/19/20 1127  Mobility  Activity Ambulated in hall  Level of Assistance Standby assist, set-up cues, supervision of patient - no hands on  Assistive Device None  Distance Ambulated (ft) 420 ft  Mobility Response Tolerated well  Mobility performed by Mobility specialist  $Mobility charge 1 Mobility   Pre-mobility: 88 HR, 93% SpO2 Post-mobility: 91 HR, 92% SpO2  Pt asx throughout ambulation. His SpO2 remained >94% on RA during ambulation. Pt laying in bed after walk.   Mamie Levers Mobility Specialist Mobility Specialist Phone: [phone]

## 2020-10-19 NOTE — Progress Notes (Signed)
Removed epicardial wires per order. 3 intact.  Pt tolerated procedure well.  Pt instructed to remain on bedrest for one hour.  Frequent vitals will be taken and documented. Pt resting with call bell within reach. Faviola Klare McClintock, RN   

## 2020-10-19 NOTE — Progress Notes (Signed)
CARDIAC REHAB PHASE I   PRE:  Rate/Rhythm: 98 SR    BP: sitting 124/73    SaO2: 91 RA  MODE:  Ambulation: 400 ft   POST:  Rate/Rhythm: 112ST    BP: sitting 151/89     SaO2: 89 RA  Pt coming out of bathroom on RA. saO2 88-91 RA. Rested then ambulated hall with standby assist, no RW. No c/o, SaO2 87-91 RA (pulse ox hard to register at times). Tolerated well, to recliner, VSS. In recliner SaO2 staying >92 RA. Practicing IS.   Discussed IS, sternal precautions, smoking cessation, diet, exercise, and CRPII with pt and wife. Voiced understanding, will refer to [facility] CRPII. They are both motivated to quit smoking. [phone]   April Bryant CES, ACSM 10/19/2020 10:12 AM

## 2020-10-19 NOTE — Progress Notes (Addendum)
[address]       [place] [address]             [phone]      3 Days Post-Op Procedure(s) (LRB): CORONARY ARTERY BYPASS GRAFTING (CABG), ON PUMP, TIMES THREE, USING LEFT INTERNAL MAMMARY ARTERY AND RIGHT ENDOSCOPICALLY HARVESTED GREATER SAPHENOUS VEIN (N/A) TRANSESOPHAGEAL ECHOCARDIOGRAM (TEE) (N/A) Subjective: Transferred to 4E yesterday.  No new concerns. Walked in the hall with a rolling walker yesterday.  Remains on O2 at 2L/[place].  No BM yet.  Objective: Vital signs in last 24 hours: Temp:  [97.8 F (36.6 C)-99 F (37.2 C)] 98.6 F (37 C) (04/18 0403) Pulse Rate:  [76-94] 82 (04/18 0403) Cardiac Rhythm: Normal sinus rhythm (04/18 0530) Resp:  [18-20] 20 (04/18 0403) BP: (108-144)/(68-112) 129/71 (04/18 0403) SpO2:  [90 %-98 %] 97 % (04/17 2314) Weight:  [69.7 kg] 69.7 kg (04/18 0512)  Hemodynamic parameters for last 24 hours:    Intake/Output from previous day: 04/17 0701 - 04/18 0700 In: 960 [P.O.:960] Out: 1040 [Urine:1040] Intake/Output this shift: No intake/output data recorded.  General appearance: alert, cooperative and no distress Neurologic: intact Heart: RRR, no significant arrhythmias on monitor.  Lungs: breath sounds clear, bibasilar ATX on CXR.  Abdomen: soft, NT.  Extremities: some swelling in hand (per patient), minimal LE edema, all well perfused.  Wound: the sternotomy incision and RLE EVH incisions are well approximated and dry.   Lab Results: Recent Labs    10/18/20 0345 10/19/20 0157  WBC 11.5* 12.0*  HGB 8.2* 8.1*  HCT 26.5* 25.5*  PLT 106* 108*   BMET:  Recent Labs    10/18/20 0345 10/19/20 0157  NA 134* 136  K 4.6 4.3  CL 101 100  CO2 27 28  GLUCOSE 139* 127*  BUN 17 12  CREATININE 0.83 0.68  CALCIUM 8.1* 7.9*    PT/INR:  Recent Labs    10/16/20 1414  LABPROT 17.2*  INR 1.4*   ABG    Component Value Date/Time   PHART 7.336 (L) 10/16/2020 1914   HCO3 26.4 10/16/2020 1914   TCO2 28  10/16/2020 1914   ACIDBASEDEF 2.0 10/16/2020 1751   O2SAT 95.0 10/16/2020 1914   CBG (last 3)  Recent Labs    10/18/20 0652 10/18/20 1121 10/18/20 1602  GLUCAP 165* 142* 129*     CLINICAL DATA:  Post cardiac surgery  EXAM: CHEST - 2 VIEW  COMPARISON:  10/18/2020  FINDINGS: Small bilateral pleural effusions and adjacent atelectasis. Probable mild interstitial edema. No pneumothorax. No remaining lines or tubes. Similar cardiomediastinal contours.  IMPRESSION: Small bilateral pleural effusions and adjacent atelectasis. Probable mild interstitial edema.   Electronically Signed   By: Guadlupe Spanish M.D.   On: 10/19/2020 08:15    Assessment/Plan: S/P Procedure(s) (LRB): CORONARY ARTERY BYPASS GRAFTING (CABG), ON PUMP, TIMES THREE, USING LEFT INTERNAL MAMMARY ARTERY AND RIGHT ENDOSCOPICALLY HARVESTED GREATER SAPHENOUS VEIN (N/A) TRANSESOPHAGEAL ECHOCARDIOGRAM (TEE) (N/A)  -POD3 CABG x 3 for MVCAD presenting with acute NSTEMI and normal EF. Progressing well. BP and cardiac rhythm stable. Continue carvedilol, atorvastatin, ASA.  D/C pacer wires today. Advance activity and wean O2.   -Volume excess- Wt still about 3kg positive. Continue diuresis with oral Lasix.  -Expected acute blood loss anemia- Hct reasonably stable, no indication for transfusion. Monitor.   -Endo- Hgb A1C 5.9 pre-op.  Glucose 130-160 past 24 hours. Monitor and continue SSI. Will encourage close f/u by PCP after discharge.   -DVT  PPX- add BID enoxaparin, ambulate.     LOS: 7 days    Leary Roca, PA-C [phone] 10/19/2020   I have seen and examined the patient and agree with the assessment and plan as outlined.  Looks good.  Possible d/c home 1-2 days.  Purcell Nails, MD 10/19/2020 9:14 AM

## 2020-10-19 NOTE — Anesthesia Postprocedure Evaluation (Signed)
Anesthesia Post Note  Patient: April Bryant  Procedure(s) Performed: CORONARY ARTERY BYPASS GRAFTING (CABG), ON PUMP, TIMES THREE, USING LEFT INTERNAL MAMMARY ARTERY AND RIGHT ENDOSCOPICALLY HARVESTED GREATER SAPHENOUS VEIN (N/A Chest) TRANSESOPHAGEAL ECHOCARDIOGRAM (TEE) (N/A )     Patient location during evaluation: ICU Anesthesia Type: General Level of consciousness: sedated and patient remains intubated per anesthesia plan Pain management: pain level controlled Vital Signs Assessment: post-procedure vital signs reviewed and stable Respiratory status: patient remains intubated per anesthesia plan Cardiovascular status: stable Postop Assessment: no apparent nausea or vomiting Anesthetic complications: no   No complications documented.  Last Vitals:  Vitals:   10/19/20 0403 10/19/20 0850  BP: 129/71 124/73  Pulse: 82 94  Resp: 20 20  Temp: 37 C 36.9 C  SpO2:  90%    Last Pain:  Vitals:   10/19/20 0850  TempSrc: Oral  PainSc:                  Beryle Lathe

## 2020-10-20 LAB — CBC
HCT: 27 % — ABNORMAL LOW (ref 36.0–46.0)
Hemoglobin: 8.4 g/dL — ABNORMAL LOW (ref 12.0–15.0)
MCH: 24.2 pg — ABNORMAL LOW (ref 26.0–34.0)
MCHC: 31.1 g/dL (ref 30.0–36.0)
MCV: 77.8 fL — ABNORMAL LOW (ref 80.0–100.0)
Platelets: 157 10*3/uL (ref 150–400)
RBC: 3.47 MIL/uL — ABNORMAL LOW (ref 3.87–5.11)
RDW: 14.9 % (ref 11.5–15.5)
WBC: 11.8 10*3/uL — ABNORMAL HIGH (ref 4.0–10.5)
nRBC: 1.2 % — ABNORMAL HIGH (ref 0.0–0.2)

## 2020-10-20 LAB — BASIC METABOLIC PANEL
Anion gap: 6 (ref 5–15)
BUN: 7 mg/dL (ref 6–20)
CO2: 30 mmol/L (ref 22–32)
Calcium: 8.2 mg/dL — ABNORMAL LOW (ref 8.9–10.3)
Chloride: 102 mmol/L (ref 98–111)
Creatinine, Ser: 0.74 mg/dL (ref 0.44–1.00)
GFR, Estimated: >60 mL/min (ref >60)
Glucose, Bld: 111 mg/dL — ABNORMAL HIGH (ref 70–99)
Potassium: 3.6 mmol/L (ref 3.5–5.1)
Sodium: 138 mmol/L (ref 135–145)

## 2020-10-20 LAB — GLUCOSE, CAPILLARY: Glucose-Capillary: 121 mg/dL — ABNORMAL HIGH (ref 70–99)

## 2020-10-20 MED ORDER — CLOPIDOGREL BISULFATE 75 MG PO TABS
75.0000 mg | ORAL_TABLET | Freq: Every day | ORAL | Status: DC
  Administered 2020-10-20: 75 mg via ORAL
  Filled 2020-10-20: qty 1

## 2020-10-20 MED ORDER — FE FUMARATE-B12-VIT C-FA-IFC PO CAPS: 1.0000 | ORAL_CAPSULE | Freq: Every day | ORAL | 1 refills | Status: DC

## 2020-10-20 MED ORDER — METOPROLOL TARTRATE 25 MG PO TABS: 25.0000 mg | ORAL_TABLET | Freq: Two times a day (BID) | ORAL | 2 refills | Status: DC

## 2020-10-20 MED ORDER — ASPIRIN EC 81 MG PO TBEC
81.0000 mg | DELAYED_RELEASE_TABLET | Freq: Every day | ORAL | Status: DC
  Administered 2020-10-20: 81 mg via ORAL
  Filled 2020-10-20: qty 1

## 2020-10-20 MED ORDER — FE FUMARATE-B12-VIT C-FA-IFC PO CAPS
1.0000 | ORAL_CAPSULE | Freq: Every day | ORAL | Status: DC
  Administered 2020-10-20: 1 via ORAL
  Filled 2020-10-20: qty 1

## 2020-10-20 MED ORDER — ATORVASTATIN CALCIUM 80 MG PO TABS: 80.0000 mg | ORAL_TABLET | Freq: Every day | ORAL | 2 refills | Status: DC

## 2020-10-20 MED ORDER — LISINOPRIL 10 MG PO TABS: 10.0000 mg | ORAL_TABLET | Freq: Every day | ORAL | 2 refills | Status: DC

## 2020-10-20 MED ORDER — METOPROLOL TARTRATE 25 MG PO TABS
25.0000 mg | ORAL_TABLET | Freq: Two times a day (BID) | ORAL | Status: DC
  Administered 2020-10-20: 25 mg via ORAL
  Filled 2020-10-20: qty 1

## 2020-10-20 MED ORDER — ACETAMINOPHEN 325 MG PO TABS: 650.0000 mg | ORAL_TABLET | ORAL | Status: DC | PRN

## 2020-10-20 MED ORDER — ASPIRIN 81 MG PO TBEC: 81.0000 mg | DELAYED_RELEASE_TABLET | Freq: Every day | ORAL | Status: AC

## 2020-10-20 MED ORDER — TRAMADOL HCL 50 MG PO TABS: 50.0000 mg | ORAL_TABLET | ORAL | 0 refills | Status: AC | PRN

## 2020-10-20 MED ORDER — LISINOPRIL 10 MG PO TABS
10.0000 mg | ORAL_TABLET | Freq: Every day | ORAL | Status: DC
  Administered 2020-10-20: 10 mg via ORAL
  Filled 2020-10-20: qty 1

## 2020-10-20 MED ORDER — CLOPIDOGREL BISULFATE 75 MG PO TABS: 75.0000 mg | ORAL_TABLET | Freq: Every day | ORAL | 5 refills | Status: DC

## 2020-10-20 NOTE — Progress Notes (Addendum)
[address]       [place] [address]             [phone]      4 Days Post-Op Procedure(s) (LRB): CORONARY ARTERY BYPASS GRAFTING (CABG), ON PUMP, TIMES THREE, USING LEFT INTERNAL MAMMARY ARTERY AND RIGHT ENDOSCOPICALLY HARVESTED GREATER SAPHENOUS VEIN (N/A) TRANSESOPHAGEAL ECHOCARDIOGRAM (TEE) (N/A) Subjective: Awake and alert, no concerns.  Progressed to independent mobility and is maintaining O2 sats on RA.  BM x 3 yesterday.  She feels she is ready to return home.   Objective: Vital signs in last 24 hours: Temp:  [98.4 F (36.9 C)-99.6 F (37.6 C)] 98.7 F (37.1 C) (04/19 0449) Pulse Rate:  [84-96] 92 (04/19 0449) Cardiac Rhythm: Normal sinus rhythm (04/18 2355) Resp:  [13-23] 20 (04/19 0500) BP: (99-157)/(58-90) 157/90 (04/19 0449) SpO2:  [90 %-98 %] 98 % (04/19 0449) Weight:  [66 kg] 66 kg (04/19 0449)  Hemodynamic parameters for last 24 hours:    Intake/Output from previous day: No intake/output data recorded. Intake/Output this shift: No intake/output data recorded.  General appearance: alert, cooperative and no distress Neurologic: intact Heart: RRR, no significant arrhythmias on monitor.  Lungs: breath sounds clear Abdomen: soft, NT.  Extremities: trace edema  Wound: the sternotomy incision and RLE EVH incisions are well approximated and dry.   Lab Results: Recent Labs    10/19/20 0157 10/20/20 0146  WBC 12.0* 11.8*  HGB 8.1* 8.4*  HCT 25.5* 27.0*  PLT 108* 157   BMET:  Recent Labs    10/19/20 0157 10/20/20 0146  NA 136 138  K 4.3 3.6  CL 100 102  CO2 28 30  GLUCOSE 127* 111*  BUN 12 7  CREATININE 0.68 0.74  CALCIUM 7.9* 8.2*    PT/INR:  No results for input(s): LABPROT, INR in the last 72 hours. ABG    Component Value Date/Time   PHART 7.336 (L) 10/16/2020 1914   HCO3 26.4 10/16/2020 1914   TCO2 28 10/16/2020 1914   ACIDBASEDEF 2.0 10/16/2020 1751   O2SAT 95.0 10/16/2020 1914   CBG (last 3)  Recent  Labs    10/19/20 1628 10/19/20 2118 10/20/20 0622  GLUCAP 137* 121* 121*     CLINICAL DATA:  Post cardiac surgery  EXAM: CHEST - 2 VIEW  COMPARISON:  10/18/2020  FINDINGS: Small bilateral pleural effusions and adjacent atelectasis. Probable mild interstitial edema. No pneumothorax. No remaining lines or tubes. Similar cardiomediastinal contours.  IMPRESSION: Small bilateral pleural effusions and adjacent atelectasis. Probable mild interstitial edema.   Electronically Signed   By: Guadlupe Spanish M.D.   On: 10/19/2020 08:15    Assessment/Plan: S/P Procedure(s) (LRB): CORONARY ARTERY BYPASS GRAFTING (CABG), ON PUMP, TIMES THREE, USING LEFT INTERNAL MAMMARY ARTERY AND RIGHT ENDOSCOPICALLY HARVESTED GREATER SAPHENOUS VEIN (N/A) TRANSESOPHAGEAL ECHOCARDIOGRAM (TEE) (N/A)  -POD4 CABG x 3 for MVCAD presenting with acute NSTEMI and normal EF. Progressing well. BP and cardiac rhythm stable. Plan to discharge on metoprolol, lisinopril, atorvastatin, Plavix and ASA 81mg .   -Volume excess- Wt now equivalent to pre-op Wt.   -Expected acute blood loss anemia- Hct trending up.   -Endo- Hgb A1C 5.9 pre-op.  Glucose 120-130 past 24 hours.  Will encourage close f/u by PCP after discharge.   -Disposition- discharge to home today. Instructions given.    LOS: 8 days    , PA-C [phone] 10/20/2020   I have seen and examined the patient and agree with the assessment  and plan as outlined.  Purcell Nails, MD 10/20/2020 10:06 AM

## 2020-10-20 NOTE — Progress Notes (Signed)
    CARDIOLOGY RECOMMENDATIONS:  Discharge today Recommendations for medications and follow up:  Discharge Medications:  Reviewed Continue medications as they are currently listed in the [facility] Exceptions to the above:  NA  Follow Up: The patient's Primary Cardiologist is Christell Constant, MD   Follow up in the office has been scheduled with Dayna Dunn PAc.   Signed,  Rollene Rotunda, MD  10:57 AM 10/20/2020  [facility]

## 2020-10-20 NOTE — Progress Notes (Signed)
Patient given discharge instructions medication list and follow up appointments. Prescriptions sent to personal pharmacy.  Patient verbalized understanding IV and tele dcd will discharge home as ordered. Transported to exit via wheel chair and hospital staff. Will discharge home as ordered. Deirdre Gryder, Randall An Rn

## 2020-10-20 NOTE — Discharge Summary (Signed)
Physician Discharge Summary  Patient ID: April Bryant MRN: [id number] DOB/AGE: 03-01-1961 60 y.o.  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 She then felt some chest pressure.She took inhaler(thinks Albuterol as thought maybe allergies)with minimal improved symptoms.She denied shortness of breath or nausea. it was hot outside so unsure of diaphoresis since she was already sweating from working outside.EMS was called. By the time EMS arrived, her symptoms were resolved.  EKG showed normal sinus rhythm, possibleleft atrial enlargement,ST-t wave abnormality .Initial Troponin(high sensitivity)was 1577 and has a max of 9893. Cardiac catheterization done today showed LVEF 55-65%, LAD proximal to mid segment with intermediate stenosis in the 65 to 75%, Circumflex vessel is small and there is a 60% stenosis, 95% stenosis of Ramus Intermediate, distal RCA is 50% stenosed, and RPDA has a 75% stenosis.Echo showed LVEF 55-60% , moderate MV regurgitation. At the time of exam, patient denies shortness of breath or chest pain.  Cardiothoracic surgery consultation was requested.  She was evaluated by Dr. Cornelius Moras who was in agreement the patient would benefit from coronary bypass grafting the risks and benefits of the procedure were explained to the patient and she was agreeable to proceed.  Hospital Course:  She remained stable and free of chest pain during her  hospitalization.  She was taken to the operating room on 10/16/2020.  She underwent CABG x 3 utilizing LIMA to LAD, SVG to PDA, and SVG to Ramus Intermediate utilizing endoscopically harvested greater saphenous vein from her right thigh.  She tolerated the procedure without difficulty and was taken to the SICU in stable condition.  She remained hemodynamically stable.  She was weaned from mechanical ventilator support and extubated routinely by 6 PM on the day of surgery.  On the first postoperative day, the monitoring lines were removed and she was mobilized.  She was transferred to 4 E., progressive care, on the second postoperative day.  Mobility continues to improve.  She was weaned from the supplemental oxygen by the third postoperative day.  Her cardiac rhythm remained stable allowing pacer wire removal on postop day 3.  Diet and activity were advanced and well-tolerated and she had return of appropriate bowel bladder function..  By the fourth postoperative day, she was completely independent with her mobility and was maintaining adequate oxygen saturation on room air.  She had expected acute blood loss anemia postoperatively which she tolerated well.  She was given iron supplements.  By the time of her discharge, she had diuresed down to her preoperative weight of 66 kg.  The sternotomy and right lower extremity EVH incisions are healing with  no sign of complication.  Consults: cardiology  Significant Diagnostic Studies:   CLINICAL DATA: Post cardiac surgery  EXAM: CHEST - 2 VIEW  COMPARISON: 10/18/2020  FINDINGS: Small bilateral pleural effusions and adjacent atelectasis. Probable mild interstitial edema. No pneumothorax. No remaining lines or tubes. Similar cardiomediastinal contours.  IMPRESSION: Small bilateral pleural effusions and adjacent atelectasis. Probable mild interstitial edema.   Electronically Signed By: Guadlupe Spanish M.D. On: 10/19/2020  08:15  Consults: cardiology  Treatments:   CARDIOTHORACIC SURGERY OPERATIVE NOTE  Date of Procedure:    10/16/2020  Preoperative Diagnosis:        Severe 3-vessel Coronary Artery Disease  S/P Acute Non-ST Segment Elevation Myocardial Infarction  Postoperative Diagnosis:    Same  Procedure:        Coronary Artery Bypass Grafting x 3              Left Internal Mammary Artery to Distal Left Anterior Descending Coronary Artery             Saphenous Vein Graft to Posterior Descending Coronary Artery             Saphenous Vein Graft to Intermediate Branch Coronary Artery             Endoscopic Vein Harvest from Right Thigh   Surgeon:        Salvatore Decent. Cornelius Moras, MD  Assistant:       Lowella Dandy, PA-C  Anesthesia:    Mal Amabile, MD  Operative Findings: ? Normal left ventricular systolic function ? Small caliber fair quality left internal mammary artery conduit ? Good quality saphenous vein conduit ? Good quality target vessels for grafting    BRIEF CLINICAL NOTE AND INDICATIONS FOR SURGERY  Patient is a 60 year old female with no previous history of coronary artery disease but risk factors notable for history of hypertension and longstanding tobacco abuse. She describes a several month history of intermittent episodes of tightness in the upper chest and neck which previously had occurred primarily with physical activity. Recently she has had a couple of episodes at rest including a particular severe episode that occurred yesterday, prompting her to summon EMS. Chest pain resolved prior to transport to the hospital. Baseline EKG revealed nonspecific ST changes but troponins were positive and trended up, consistent with non-ST segment elevation myocardial infarction. The patient has not had any recurrent chest pain since admission.  Discharge Exam: Blood pressure (!) 136/100, pulse 94, temperature 98.8 F (37.1 C), temperature source Oral, resp. rate 20, height 5\' 3"   (1.6 m), weight 66 kg, SpO2 97 %.  General appearance: alert, cooperative and no distress Neurologic: intact Heart: RRR, no significant arrhythmias on monitor.  Lungs: breath sounds clear Abdomen: soft, NT.  Extremities: trace edema  Wound: the sternotomy incision and RLE EVH incisions are well approximated and dry.   Disposition:   Discharge Instructions    Amb Referral to Cardiac Rehabilitation   Complete by: As directed    Diagnosis:  CABG NSTEMI     CABG X ___: 3   After initial evaluation and assessments completed: Virtual Based Care may be provided alone or in conjunction with Phase 2 Cardiac Rehab based on patient barriers.: Yes     Allergies as of 10/20/2020      Reactions   Codeine Anaphylaxis      Medication List    STOP taking these medications   meloxicam 15 MG tablet Commonly known as: MOBIC     TAKE these medications   acetaminophen 325 MG tablet Commonly known as: TYLENOL Take 2 tablets (650 mg total) by mouth every 4 (four) hours as needed for headache or mild pain.   aspirin 81 MG EC tablet Take 1 tablet (81 mg total) by mouth daily. Swallow whole. Start taking on: October 21, 2020   atorvastatin 80 MG tablet Commonly known as: LIPITOR Take 1 tablet (80 mg total) by mouth daily. Start taking on: October 21, 2020   clopidogrel 75  MG tablet Commonly known as: PLAVIX Take 1 tablet (75 mg total) by mouth daily. Start taking on: October 21, 2020   ferrous fumarate-b12-vitamic C-folic acid capsule Commonly known as: TRINSICON / FOLTRIN Take 1 capsule by mouth daily with breakfast. Start taking on: October 21, 2020   lisinopril 10 MG tablet Commonly known as: ZESTRIL Take 1 tablet (10 mg total) by mouth daily. Start taking on: October 21, 2020 What changed:   medication strength  how much to take   metoprolol tartrate 25 MG tablet Commonly known as: LOPRESSOR Take 1 tablet (25 mg total) by mouth 2 (two) times daily.   traMADol 50 MG  tablet Commonly known as: ULTRAM Take 1 tablet (50 mg total) by mouth every 4 (four) hours as needed for up to 5 days for moderate pain.       Follow-up Information    Laurann Montana, PA-C. Go on 11/10/2020.   Specialties: Cardiology, Radiology Why: Your appointment is at 3:15pm Contact information: [address] [place] [address] [phone]        [facility] [place]. Go on 11/16/2020.   Specialty: Cardiothoracic Surgery Why: Your appointment is at 2pm. Please arrive 30 minutes early for a chest x-ray to be performed by [facility] located on the first floor of the same building.  Contact information: [address] [place], [address] [place] [address] [phone]       [facility] [place]. Go on 10/27/2020.   Specialty: Cardiothoracic Surgery Why: You appointment for suture removal is at [facility] information: [address] [place], [address] [place] [address] [phone]             The patient has been discharged on:   1.Beta Blocker:  Yes [ x ]                              No   [   ]                              If No, reason:  2.Ace Inhibitor/ARB: Yes [ x]                                     No  [    ]                                     If No, reason:  3.Statin:   Yes [x ]                  No  [   ]                  If No, reason:  4.Ecasa:  Yes  [x  ]                  No   [   ]                  If No, reason:    Signed: Leary Roca, PA-C 10/20/2020, 9:42 AM

## 2020-10-21 ENCOUNTER — Encounter (HOSPITAL_COMMUNITY): Payer: Self-pay | Admitting: Thoracic Surgery (Cardiothoracic Vascular Surgery)

## 2020-10-21 MED FILL — Heparin Sodium (Porcine) Inj 1000 Unit/ML: INTRAMUSCULAR | Qty: 10 | Status: AC

## 2020-10-21 MED FILL — Electrolyte-R (PH 7.4) Solution: INTRAVENOUS | Qty: 4000 | Status: AC

## 2020-10-21 MED FILL — Mannitol IV Soln 20%: INTRAVENOUS | Qty: 500 | Status: AC

## 2020-10-21 MED FILL — Sodium Chloride IV Soln 0.9%: INTRAVENOUS | Qty: 2000 | Status: AC

## 2020-10-21 MED FILL — Sodium Bicarbonate IV Soln 8.4%: INTRAVENOUS | Qty: 50 | Status: AC

## 2020-10-21 MED FILL — Lidocaine HCl Local Soln Prefilled Syringe 100 MG/5ML (2%): INTRAMUSCULAR | Qty: 5 | Status: AC

## 2020-10-27 ENCOUNTER — Ambulatory Visit (INDEPENDENT_AMBULATORY_CARE_PROVIDER_SITE_OTHER): Payer: Self-pay | Admitting: *Deleted

## 2020-10-27 ENCOUNTER — Other Ambulatory Visit: Payer: Self-pay

## 2020-10-27 ENCOUNTER — Telehealth: Payer: Self-pay | Admitting: *Deleted

## 2020-10-27 DIAGNOSIS — Z4802 Encounter for removal of sutures: Secondary | ICD-10-CM

## 2020-10-27 NOTE — Telephone Encounter (Signed)
Left Ms. Profit a VM r/t RA medication. Per E. Barrett, PA, patient advised she may take her RA medication sparingly as needed for pain. Return phone number provided if more questions arise.

## 2020-10-27 NOTE — Progress Notes (Signed)
Patient arrived for nurse visit to remove sutures post-CABG 4/15 by Dr. Cornelius Moras. Four sutures removed with no signs or symptoms of infection noted.  Incisions well approximated.  Patient tolerated suture removal well.  Patient and family instructed to keep the incision site clean and dry. Patient and family acknowledged instructions given.  Patient with questions regarding RA medication that was stopped at discharge. Per patient, she has had flare ups of RA since hospital discharge. Message sent to PA for further advice in this matter.  All questions answered.

## 2020-11-09 ENCOUNTER — Encounter: Payer: Self-pay | Admitting: Physician Assistant

## 2020-11-09 NOTE — Progress Notes (Signed)
Cardiology Office Note    Date:  11/10/2020   ID:  April Bryant, DOB 1960-10-28, MRN [id number]  PCP:  Farris Has, MD  Cardiologist:  Christell Constant, MD  Electrophysiologist:  None   Chief Complaint: f/u CABG  History of Present Illness:    April Bryant is a 60 y.o. female with history of HTN, RA, tobacco abuse, and recent CAD with NSTEMI s/p CABG, HLD, moderately elevated PASP by first echo, mild MR by TEE who presents for post-hospital follow-up. She was recently admitted with sudden onset of feeling like her throat was closing. She ruled in for NSTEMI (troponin >9k) and cath 10/13/20 showed MVCAD. 2D Echo EF 55-60%, grade 2 Dd, moderately elevated PASP, moderate MR, mild-mod LAE. Underwent CABGx3 on 10/16/20 with free LIMA-LAD, SVG-rI, SVG-PDA. Intra-op TEE showed only mild MR and no significant TR. Smoking cessation advised. Post-hospital course notable for expected ABL anemia and diuresis.  She is seen back today with wife Jill Side. In general she reports what sounds like expected post-op recovery feeling a little better each day. Sternal wall pain is improving. No SOB or orthopnea. Blood pressure remains quite high. She was hypertensive at times in the hospital as well. Looking back she reports she was on lisinopril 30mg  prior to admission when it wasn't unusual for her to have BPs in the 200s on a regular basis. She was also on carvedilol in the hospital and went home on metoprolol on discharge day. She is seeing readings in the 160s-170s systolic at home, one outlier in the 150s. When lying back to have her EKG today she felt two of her endoscopic site scabs pull apart.  Labwork independently reviewed: 10/2020 K 3.6, Cr 0.74, Hgb 8.4,Mg 2.3, LFTs ok, LDL 134, A1C 5.6   Past Medical History:  Diagnosis Date  . CAD in native artery   . Hyperlipidemia   . Hypertension   . Mild mitral regurgitation   . Pulmonary hypertension (HCC)   . Rheumatoid arthritis (HCC)   .  S/P CABG x 3 10/16/2020   Free LIMA to LAD, SVG to Intermediate, SVG to PDA  . Tobacco abuse     Past Surgical History:  Procedure Laterality Date  . CORONARY ARTERY BYPASS GRAFT N/A 10/16/2020   Procedure: CORONARY ARTERY BYPASS GRAFTING (CABG), ON PUMP, TIMES THREE, USING LEFT INTERNAL MAMMARY ARTERY AND RIGHT ENDOSCOPICALLY HARVESTED GREATER SAPHENOUS VEIN;  Surgeon: 10/18/2020, MD;  Location: [facility] OR;  Service: Open Heart Surgery;  Laterality: N/A;  . INTRAVASCULAR PRESSURE WIRE/FFR STUDY N/A 10/13/2020   Procedure: INTRAVASCULAR PRESSURE WIRE/FFR STUDY;  Surgeon: 12/13/2020, MD;  Location: [facility] INVASIVE CV LAB;  Service: Cardiovascular;  Laterality: N/A;  . LEFT HEART CATH AND CORONARY ANGIOGRAPHY N/A 10/13/2020   Procedure: LEFT HEART CATH AND CORONARY ANGIOGRAPHY;  Surgeon: 12/13/2020, MD;  Location: [facility] INVASIVE CV LAB;  Service: Cardiovascular;  Laterality: N/A;  . TEE WITHOUT CARDIOVERSION N/A 10/16/2020   Procedure: TRANSESOPHAGEAL ECHOCARDIOGRAM (TEE);  Surgeon: 10/18/2020, MD;  Location: [facility] OR;  Service: Open Heart Surgery;  Laterality: N/A;    Current Medications: Current Meds  Medication Sig  . acetaminophen (TYLENOL) 325 MG tablet Take 2 tablets (650 mg total) by mouth every 4 (four) hours as needed for headache or mild pain.  [facility] aspirin EC 81 MG EC tablet Take 1 tablet (81 mg total) by mouth daily. Swallow whole.  Marland Kitchen atorvastatin (LIPITOR) 80 MG tablet Take 1 tablet (80 mg total)  by mouth daily.  . clopidogrel (PLAVIX) 75 MG tablet Take 1 tablet (75 mg total) by mouth daily.  . ferrous fumarate-b12-vitamic C-folic acid (TRINSICON / FOLTRIN) capsule Take 1 capsule by mouth daily with breakfast.  . lisinopril (ZESTRIL) 10 MG tablet Take 1 tablet (10 mg total) by mouth daily.  . metoprolol tartrate (LOPRESSOR) 25 MG tablet Take 1 tablet (25 mg total) by mouth 2 (two) times daily.      Allergies:   Codeine   Social History   Socioeconomic History  . Marital  status: Married    Spouse name: Not on file  . Number of children: 1  . Years of education: Not on file  . Highest education level: Not on file  Occupational History  . Not on file  Tobacco Use  . Smoking status: Current Some Day Smoker  . Smokeless tobacco: Never Used  . Tobacco comment: pt is trying to quit smoking  Vaping Use  . Vaping Use: Never used  Substance and Sexual Activity  . Alcohol use: Yes    Alcohol/week: 2.0 standard drinks    Types: 2 Cans of beer per week    Comment: 2-3 cans of beer a day  . Drug use: Never  . Sexual activity: Yes  Other Topics Concern  . Not on file  Social History Narrative  . Not on file   Social Determinants of Health   Financial Resource Strain: Not on file  Food Insecurity: Not on file  Transportation Needs: Not on file  Physical Activity: Not on file  Stress: Not on file  Social Connections: Not on file     Family History:  The patient's family history includes Breast cancer in her mother; Heart failure in her mother.  ROS:   Please see the history of present illness.  All other systems are reviewed and otherwise negative.    EKGs/Labs/Other Studies Reviewed:    Studies reviewed are outlined and summarized above. Reports included below if pertinent.  Cardiac cath 10/13/20   Culprit lesion is a large branching ramus intermedius and has ostial to proximal narrowing without landing zone for PCI.  Left main is widely patent  LAD proximal to mid segment with intermediate stenosis in the 65 to 75% range documented to be hemodynamically significant by both RFR (0.82) and FFR (0.72).  Circumflex is relatively small.  There is a moderate-sized third obtuse marginal.  The mid vessel contains eccentric 50 to 60% narrowing.  RCA is dominant.  There is significant tortuosity.  The distal RCA before the PDA contains segmental 50% narrowing.  The ostial to proximal PDA is 60-70% narrowed.  Overall LV function is normal with EF  60%.  RECOMMENDATIONS:   TCTS consultation.  There is three-vessel coronary artery disease in this patient who smokes, has significant hypertension, is prediabetic, and hyperlipidemia.  PCI on ramus intermedius would require overhang of stent into the distal left main.  2D echo 10/13/20 1. Left ventricular ejection fraction, by estimation, is 55 to 60%. The  left ventricle has normal function. The left ventricle has no regional  wall motion abnormalities. Left ventricular diastolic parameters are  consistent with Grade II diastolic  dysfunction (pseudonormalization).  2. Right ventricular systolic function is normal. The right ventricular  size is normal. There is moderately elevated pulmonary artery systolic  pressure.  3. Left atrial size was mild to moderately dilated.  4. The mitral valve is normal in structure. Moderate mitral valve  regurgitation. No evidence of mitral stenosis.  5. The aortic valve is normal in structure. Aortic valve regurgitation is  not visualized. No aortic stenosis is present.   IntraOp TEE 10/16/20 POST-OP IMPRESSIONS  - Left Ventricle: The left ventricle is unchanged from pre-bypass.  - Right Ventricle: The right ventricle appears unchanged from pre-bypass.  - Aorta: The aorta appears unchanged from pre-bypass.  - Left Atrium: The left atrium appears unchanged from pre-bypass.  - Left Atrial Appendage: The left atrial appendage appears unchanged from  pre-bypass.  - Aortic Valve: The aortic valve appears unchanged from pre-bypass.  - Mitral Valve: The mitral valve appears unchanged from pre-bypass.  - Tricuspid Valve: The tricuspid valve appears unchanged from pre-bypass.  - Interatrial Septum: The interatrial septum appears unchanged from  pre-bypass.  - Interventricular Septum: The interventricular septum appears unchanged  from  pre-bypass.  - Pericardium: The pericardium appears unchanged from pre-bypass.   PRE-OP FINDINGS  Left  Ventricle: The left ventricle has normal systolic function, with an  ejection fraction of 55-60%. The cavity size was normal. There is no  increase in left ventricular wall thickness. There is no left ventricular  hypertrophy.    Right Ventricle: The right ventricle has normal systolic function. The  cavity was normal. There is no increase in right ventricular wall  thickness.   Left Atrium: Left atrial size was dilated. No left atrial/left atrial  appendage thrombus was detected.   Right Atrium: Right atrial size was normal in size.   Interatrial Septum: No atrial level shunt detected by color flow Doppler.   Pericardium: There is no evidence of pericardial effusion.   Mitral Valve: The mitral valve is normal in structure. Mitral valve  regurgitation is mild by color flow Doppler. There is No evidence of  mitral stenosis.   Tricuspid Valve: The tricuspid valve was normal in structure. Tricuspid  valve regurgitation was not visualized by color flow Doppler.   Aortic Valve: The aortic valve is tricuspid Aortic valve regurgitation was  not visualized by color flow Doppler. There is no stenosis of the aortic  valve.    Pulmonic Valve: The pulmonic valve was not assessed.  Pulmonic valve regurgitation was not assessed by color flow Doppler.    Aorta: The aortic root, ascending aorta and aortic arch are normal in size  and structure.     Leslye Peer MD  Electronically signed by Leslye Peer MD  Signature Date/Time: 10/16/2020/12:04:47 PM       EKG:  EKG is ordered today, personally reviewed, demonstrating NSR 80bpm, TWI II, III, avF, V4-V6, baseline artifact lead V2, nonspecific TWI V2, LVH, overall similar to 10/17/20  Recent Labs: 10/14/2020: ALT 21 10/17/2020: Magnesium 2.3 10/20/2020: BUN 7; Creatinine, Ser 0.74; Hemoglobin 8.4; Platelets 157; Potassium 3.6; Sodium 138  Recent Lipid Panel    Component Value Date/Time   CHOL 216 (H) 10/13/2020 0145   TRIG 82  10/13/2020 0145   HDL 66 10/13/2020 0145   CHOLHDL 3.3 10/13/2020 0145   VLDL 16 10/13/2020 0145   LDLCALC 134 (H) 10/13/2020 0145    PHYSICAL EXAM:    VS:  BP (!) 174/92   Pulse 79   Ht 5\' 3"  (1.6 m)   Wt 139 lb 9.6 oz (63.3 kg)   SpO2 98%   BMI 24.73 kg/m   BMI: Body mass index is 24.73 kg/m.  GEN: Well nourished, well developed female in no acute distress HEENT: normocephalic, atraumatic Neck: no JVD, carotid bruits, or masses Cardiac: RRR; no murmurs, rubs, or gallops, no edema  Respiratory:  clear to auscultation bilaterally, normal work of breathing GI: soft, nontender, nondistended, + BS MS: no deformity or atrophy Skin: warm and dry, no rash, sternal scar and wounds c/d/i except left two endoscopic sites appear as though the external eschar has been slightly disrupted with pink healthy appearing granulomatous tissue underneath without evidence for drainage, pus or infection Neuro:  Alert and Oriented x 3, Strength and sensation are intact, follows commands Psych: euthymic mood, full affect  Wt Readings from Last 3 Encounters:  11/10/20 139 lb 9.6 oz (63.3 kg)  10/20/20 145 lb 8.1 oz (66 kg)     ASSESSMENT & PLAN:   1. CAD s/p CABG with recent NSTEMI - doing well from cardiac standpoint, progressing well. Reviewed endoscopic sites with Dr. Shari Prows (DOD) who does not feel there is any acute issue - just disruption of previous scab with expected healing tissue underneath. Wound care precautions reviewed. Has surgical f/u 11/16/20. Continue ASA, BB, statin, Plavix. Update BMET, CBC today.   2. Hyperlipidemia - started on statin last admission. Will plan office visit follow-up in 6-8 weeks at which time lipids/LFTs can be followed up. They live 40 minutes away so easier to do during an OV. Offered to draw labs in [place] and they prefer obtaining here.  3. Essential HTN - suboptimal control post-surgery. Not surprising given her report that she was previously frequently  seeing SBPs in the 200s pre-surgery on a higher dose of lisinopril. She is on  daily now and is running low on supply of these. She still has a good supply of the previous  tablets on hand. Per our discussion, we will have her take an additional 2 tablets of the  dose ( ) today to equal  total. Then for the next few days she will take one of her  tablets daily instead. I asked her to relay her BP readings to [place] on Friday afternoon to guide next steps. If BP still elevated - which I anticipate it will be - we will change metoprolol to carvedilol with dose tailored to residual HTN. She was on this in the hospital but it looks like it was changed to metoprolol at discharge. Update BMET, TSH today, then recheck BMET 7-10 days given lisinopril increase.  4. Moderate pulm HTN - question related to underlying acute issues on original echo. There was no significant TR jet on her intra-op TEE. Breathing is stable. This can be followed by subsequent echocardiogram at the discretion of her primary cardiologist.  5. Mild MR - per guidelines would consider repeat echo 3-5 years.  Disposition: F/u with Dr. Izora Ribas in 6-8 weeks.   Medication Adjustments/Labs and Tests Ordered: Current medicines are reviewed at length with the patient today.  Concerns regarding medicines are outlined above. Medication changes, Labs and Tests ordered today are summarized above and listed in the Patient Instructions accessible in Encounters.   Signed, Laurann Montana, PA-C  11/10/2020 3:29 PM    [facility] [address] [place], [place], [place]  [address] Phone: [phone]; Fax: [phone]

## 2020-11-10 ENCOUNTER — Ambulatory Visit (INDEPENDENT_AMBULATORY_CARE_PROVIDER_SITE_OTHER): Payer: 59 | Admitting: Physician Assistant

## 2020-11-10 ENCOUNTER — Encounter: Payer: Self-pay | Admitting: Physician Assistant

## 2020-11-10 ENCOUNTER — Other Ambulatory Visit: Payer: Self-pay

## 2020-11-10 VITALS — BP 174/92 | HR 79 | Ht 63.0 in | Wt 139.6 lb

## 2020-11-10 DIAGNOSIS — I272 Pulmonary hypertension, unspecified: Secondary | ICD-10-CM

## 2020-11-10 DIAGNOSIS — Z951 Presence of aortocoronary bypass graft: Secondary | ICD-10-CM

## 2020-11-10 DIAGNOSIS — I10 Essential (primary) hypertension: Secondary | ICD-10-CM

## 2020-11-10 DIAGNOSIS — I2510 Atherosclerotic heart disease of native coronary artery without angina pectoris: Secondary | ICD-10-CM | POA: Diagnosis not present

## 2020-11-10 DIAGNOSIS — E785 Hyperlipidemia, unspecified: Secondary | ICD-10-CM | POA: Diagnosis not present

## 2020-11-10 DIAGNOSIS — I340 Nonrheumatic mitral (valve) insufficiency: Secondary | ICD-10-CM

## 2020-11-10 MED ORDER — LISINOPRIL 30 MG PO TABS: 30.0000 mg | ORAL_TABLET | Freq: Every day | ORAL | 0 refills | Status: DC

## 2020-11-10 NOTE — Patient Instructions (Addendum)
Medication Instructions:  Your physician has recommended you make the following change in your medication:  1.   Today you will take 2 additional 10mg  tablets of lisinopril to equal 30mg  for the day.  For the next several days thereafter, go back to your previous 30mg  tablets.  On Friday afternoon please call with your blood pressure readings.  *If you need a refill on your cardiac medications before your next appointment, please call your pharmacy*   Lab Work: TODAY:  BMET, CBC, & TSH  1 WEEK, 11/17/2020:  COME TO THE OFFICE FOR BMET, ANYTIME AFTER 7:30   If you have labs (blood work) drawn today and your tests are completely normal, you will receive your results only by: Sunday MyChart Message (if you have MyChart) OR . A paper copy in the mail If you have any lab test that is abnormal or we need to change your treatment, we will call you to review the results.   Testing/Procedures: None ordered   Follow-Up: At [facility], you and your health needs are our priority.  As part of our continuing mission to provide you with exceptional heart care, we have created designated Provider Care Teams.  These Care Teams include your primary Cardiologist (physician) and Advanced Practice Providers (APPs -  Physician Assistants and Nurse Practitioners) who all work together to provide you with the care you need, when you need it.  We recommend signing up for the patient portal called "MyChart".  Sign up information is provided on this After Visit Summary.  MyChart is used to connect with patients for Virtual Visits (Telemedicine).  Patients are able to view lab/test results, encounter notes, upcoming appointments, etc.  Non-urgent messages can be sent to your provider as well.   To learn more about what you can do with MyChart, go to 11/19/2020.    Your next appointment:   01/05/2021  The format for your next appointment:   In Person  Provider:   [facility],  MD   Other Instructions

## 2020-11-11 ENCOUNTER — Other Ambulatory Visit: Payer: Self-pay | Admitting: *Deleted

## 2020-11-11 LAB — BASIC METABOLIC PANEL
BUN/Creatinine Ratio: 11 — ABNORMAL LOW (ref 12–28)
BUN: 8 mg/dL (ref 8–27)
CO2: 26 mmol/L (ref 20–29)
Calcium: 9.3 mg/dL (ref 8.7–10.3)
Chloride: 105 mmol/L (ref 96–106)
Creatinine, Ser: 0.75 mg/dL (ref 0.57–1.00)
Glucose: 122 mg/dL — ABNORMAL HIGH (ref 65–99)
Potassium: 3.4 mmol/L — ABNORMAL LOW (ref 3.5–5.2)
Sodium: 144 mmol/L (ref 134–144)
eGFR: 91 mL/min/{1.73_m2} (ref >59)

## 2020-11-11 LAB — TSH: TSH: 0.83 u[IU]/mL (ref 0.450–4.500)

## 2020-11-11 LAB — CBC
Hematocrit: 36 % (ref 34.0–46.6)
Hemoglobin: 10.9 g/dL — ABNORMAL LOW (ref 11.1–15.9)
MCH: 22.9 pg — ABNORMAL LOW (ref 26.6–33.0)
MCHC: 30.3 g/dL — ABNORMAL LOW (ref 31.5–35.7)
MCV: 76 fL — ABNORMAL LOW (ref 79–97)
Platelets: 249 10*3/uL (ref 150–450)
RBC: 4.76 x10E6/uL (ref 3.77–5.28)
RDW: 14.5 % (ref 11.7–15.4)
WBC: 6.7 10*3/uL (ref 3.4–10.8)

## 2020-11-11 MED ORDER — POTASSIUM CHLORIDE CRYS ER 20 MEQ PO TBCR: 40.0000 meq | EXTENDED_RELEASE_TABLET | ORAL | 0 refills | Status: DC

## 2020-11-12 ENCOUNTER — Other Ambulatory Visit: Payer: Self-pay | Admitting: Thoracic Surgery (Cardiothoracic Vascular Surgery)

## 2020-11-12 DIAGNOSIS — Z951 Presence of aortocoronary bypass graft: Secondary | ICD-10-CM

## 2020-11-16 ENCOUNTER — Other Ambulatory Visit: Payer: Self-pay

## 2020-11-16 ENCOUNTER — Ambulatory Visit (INDEPENDENT_AMBULATORY_CARE_PROVIDER_SITE_OTHER): Payer: Self-pay | Admitting: Physician Assistant

## 2020-11-16 ENCOUNTER — Ambulatory Visit
Admission: RE | Admit: 2020-11-16 | Discharge: 2020-11-16 | Disposition: A | Discharge status: Home / Self Care | Payer: 59 | Source: Ambulatory Visit | Attending: Thoracic Surgery (Cardiothoracic Vascular Surgery) | Admitting: Thoracic Surgery (Cardiothoracic Vascular Surgery)

## 2020-11-16 ENCOUNTER — Other Ambulatory Visit: Payer: 59 | Admitting: *Deleted

## 2020-11-16 VITALS — BP 156/100 | HR 85 | Temp 97.9°F | Resp 20 | Ht 63.0 in | Wt 136.0 lb

## 2020-11-16 DIAGNOSIS — I272 Pulmonary hypertension, unspecified: Secondary | ICD-10-CM

## 2020-11-16 DIAGNOSIS — Z951 Presence of aortocoronary bypass graft: Secondary | ICD-10-CM

## 2020-11-16 DIAGNOSIS — I10 Essential (primary) hypertension: Secondary | ICD-10-CM

## 2020-11-16 DIAGNOSIS — I340 Nonrheumatic mitral (valve) insufficiency: Secondary | ICD-10-CM

## 2020-11-16 DIAGNOSIS — I2510 Atherosclerotic heart disease of native coronary artery without angina pectoris: Secondary | ICD-10-CM

## 2020-11-16 DIAGNOSIS — E785 Hyperlipidemia, unspecified: Secondary | ICD-10-CM

## 2020-11-16 LAB — BASIC METABOLIC PANEL
BUN/Creatinine Ratio: 11 — ABNORMAL LOW (ref 12–28)
BUN: 8 mg/dL (ref 8–27)
CO2: 23 mmol/L (ref 20–29)
Calcium: 9.5 mg/dL (ref 8.7–10.3)
Chloride: 102 mmol/L (ref 96–106)
Creatinine, Ser: 0.72 mg/dL (ref 0.57–1.00)
Glucose: 98 mg/dL (ref 65–99)
Potassium: 4.5 mmol/L (ref 3.5–5.2)
Sodium: 140 mmol/L (ref 134–144)
eGFR: 96 mL/min/{1.73_m2} (ref >59)

## 2020-11-16 NOTE — Patient Instructions (Signed)
No change in medications from CT surgery standpoint. May resume driving. Observe sternal precautions for another 6 weeks. Follow-up with cardiology regarding hypertension.

## 2020-11-16 NOTE — Progress Notes (Signed)
  HPI: Patient returns for routine postoperative follow-up having undergone CABG x3 4 weeks ago by Dr. Cornelius Moras after presenting with acute non-ST elevation myocardial infarction.  The patient's early postoperative recovery while in the hospital was uneventful.  Since hospital discharge the patient reports she has continued to progress.  She denies having any further chest pain or shortness of breath.  Her energy is gradually improving.  She is having a few visits of rheumatoid arthritis which she takes Mobic.   Current Outpatient Medications  Medication Sig Dispense Refill  . acetaminophen (TYLENOL) 325 MG tablet Take 2 tablets (650 mg total) by mouth every 4 (four) hours as needed for headache or mild pain.    Marland Kitchen aspirin EC 81 MG EC tablet Take 1 tablet (81 mg total) by mouth daily. Swallow whole.    Marland Kitchen atorvastatin (LIPITOR) 80 MG tablet Take 1 tablet (80 mg total) by mouth daily. 30 tablet 2  . clopidogrel (PLAVIX) 75 MG tablet Take 1 tablet (75 mg total) by mouth daily. 30 tablet 5  . ferrous fumarate-b12-vitamic C-folic acid (TRINSICON / FOLTRIN) capsule Take 1 capsule by mouth daily with breakfast. 30 capsule 1  . lisinopril (ZESTRIL) 10 MG tablet Take 1 tablet (10 mg total) by mouth daily. 30 tablet 2  . lisinopril (ZESTRIL) 30 MG tablet Take 1 tablet (30 mg total) by mouth daily. 30 tablet 0  . metoprolol tartrate (LOPRESSOR) 25 MG tablet Take 1 tablet (25 mg total) by mouth 2 (two) times daily. 60 tablet 2  . potassium chloride SA (KLOR-CON) 20 MEQ tablet Take 2 tablets (40 mEq total) by mouth as directed. Take 2 tabs today. 2 tablet 0   No current facility-administered medications for this visit.    Physical Exam:  BP  156/100 Pulse  85 RESP  20 Temp  97.9 SPO2  95%  Heart: Regular rate and rhythm Chest: Sternotomy incision and chest tube site incisions are all well-healed.  Sternum is stable.  Breath sounds are clear to auscultation throughout.  Chest x-ray is continue to improve  since discharge. Extremities: No peripheral edema, the right lower extremity EVH incision is healing with no sign of complication.     Diagnostic Tests:  EXAM: CHEST - 2 VIEW  COMPARISON:  1422  FINDINGS: Prior CABG. Midline trachea. Mild cardiomegaly. Trace bilateral pleural fluid, decreased. No pneumothorax. Basilar predominant interstitial thickening is likely related to COPD/chronic bronchitis in this smoker. No overt congestive failure. Mild subsegmental atelectasis within both lung bases.  IMPRESSION: Improved aeration with decreased tiny bilateral pleural effusions and areas of basilar predominant atelectasis.  No pneumothorax or other acute complication.   Electronically Signed   By: Jeronimo Greaves M.D.   On: 11/16/2020 13:30   Impression / Plan: Ms Marlar is progressing well following coronary bypass grafting x3 using acute non-ST elevation myocardial infarction.  Elevated blood pressure is noted.  This is being managed by Ms. Dayna Dunn so unable to make any changes to her blood pressure medication today.  As per the previous plan, I have asked her to avoid restarting the Mobic for another 2 weeks if at all possible.  She may resume driving.  Sternal precautions were reviewed.  Follow-up as needed.    Leary Roca, PA-C [facility] [phone]

## 2020-11-17 ENCOUNTER — Other Ambulatory Visit: Payer: 59

## 2020-11-17 MED ORDER — LISINOPRIL 40 MG PO TABS: 40.0000 mg | ORAL_TABLET | Freq: Every day | ORAL | 11 refills | Status: DC

## 2020-11-17 NOTE — Telephone Encounter (Signed)
Please call patient - thank her for replying back with her BP readings. BMET stable on present regimen.  I would like her to increase the lisinopril to 40mg  daily. She had a 30mg  tablet on hand from before as well as recently prescribed 10mg  tablet from the surgery team. She can take both of these together until used up (to equal 40mg  daily), but please provide rx for 40mg  tablet for next fill.  Continue to trend BPs and let know Friday or Monday how they are running.  Would also add CMET/lipid profile to her visit with Dr. 7/6.  Thank you!

## 2020-11-17 NOTE — Telephone Encounter (Signed)
Instructed the patient to INCREASE LISINOPRIL to 40 mg daily.  She will send another message in several days with more BP readings.  Instructed her to come FASTING to her appointment with Dr. Izora Ribas in July.  She was grateful for call and agrees with plan.

## 2020-11-24 MED ORDER — CARVEDILOL 6.25 MG PO TABS
6.2500 mg | ORAL_TABLET | Freq: Two times a day (BID) | ORAL | 1 refills | Status: DC
Start: 2020-11-24 — End: 2021-01-06

## 2020-11-24 NOTE — Telephone Encounter (Signed)
Please have patient stop metoprolol and start carvedilol 6.25mg  BID. She might end up needing 12.5mg  BID but given recent post-hospital status, would prefer to start conservatively first since readings otherwise are pretty close to goal. Continue to follow BP as she has been doing and submit ~4-5 days of readings on the new med. Thank you!

## 2020-12-12 ENCOUNTER — Other Ambulatory Visit: Payer: Self-pay | Admitting: Physician Assistant

## 2020-12-22 ENCOUNTER — Encounter: Payer: Self-pay | Admitting: Internal Medicine

## 2021-01-05 ENCOUNTER — Ambulatory Visit: Payer: 59 | Admitting: Internal Medicine

## 2021-01-05 NOTE — Progress Notes (Signed)
Cardiology Office Note    Date:  01/06/2021   ID:  MISAO FACKRELL, DOB Dec 04, 1960, MRN [id number]  PCP:  Farris Has, MD  Cardiologist:  Christell Constant, MD  Electrophysiologist:  None   CC: Follow up CABG  History of Present Illness:    April Bryant is a 60 y.o. female with history of HTN, RA, tobacco abuse, and recent CAD with NSTEMI s/p CABG, HLD, mmild MR by TEE who presents for post-hospital follow-up. She was recently admitted with sudden onset of feeling like her throat was closing. She ruled in for NSTEMI (troponin >9k) and cath 10/13/20 showed MVCAD. 2D Echo EF 55-60%, grade 2 Dd, moderately elevated PASP, moderate MR, mild-mod LAE. Underwent CABGx3 on 10/16/20 with free LIMA-LAD, SVG-rI, SVG-PDA. Intra-op TEE showed only mild MR and no significant TR. Smoking cessation advised. Post-hospital course notable for expected ABL anemia and diuresis.  Seen 10/12/20.  In interim of this visit, patient saw PA D Dunn 11/10/20.  With this had persistent HTN. Seen 01/06/21  Patient notes that she is doing good overall.  Still has some numbness over the sternum.  Has been started on steroids because of her excruciating RA.  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  No chest pain or pressure .  No SOB/DOE and no PND/Orthopnea.  No weight gain or leg swelling.  No palpitations or syncope. Has had some weight loss,  no night sweats (nut no hematemesis or hempytysos).  Able to shovel gravel. Does have some neck discomfort, unclear if this is the same as her NSTMEI eval.  Stil having some sternal wound tightness with activity.  Has been released from CT surgery.   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   Free LIMA to LAD, SVG to Intermediate, SVG to PDA   Tobacco abuse     Past Surgical History:  Procedure  Laterality Date   CORONARY ARTERY BYPASS GRAFT N/A 10/16/2020   Procedure: CORONARY ARTERY BYPASS GRAFTING (CABG), ON PUMP, TIMES THREE, USING LEFT INTERNAL MAMMARY ARTERY AND RIGHT ENDOSCOPICALLY HARVESTED GREATER SAPHENOUS VEIN;  Surgeon: Purcell Nails, MD;  Location: [facility] OR;  Service: Open Heart Surgery;  Laterality: N/A;   INTRAVASCULAR PRESSURE WIRE/FFR STUDY N/A 10/13/2020   Procedure: INTRAVASCULAR PRESSURE WIRE/FFR STUDY;  Surgeon: Lyn Records, MD;  Location: [facility] INVASIVE CV LAB;  Service: Cardiovascular;  Laterality: N/A;   LEFT HEART CATH AND CORONARY ANGIOGRAPHY N/A 10/13/2020   Procedure: LEFT HEART CATH AND CORONARY ANGIOGRAPHY;  Surgeon: Lyn Records, MD;  Location: [facility] INVASIVE CV LAB;  Service: Cardiovascular;  Laterality: N/A;   TEE WITHOUT CARDIOVERSION N/A 10/16/2020   Procedure: TRANSESOPHAGEAL ECHOCARDIOGRAM (TEE);  Surgeon: Purcell Nails, MD;  Location: [facility] OR;  Service: Open Heart Surgery;  Laterality: N/A;    Current Medications: Current Meds  Medication Sig   acetaminophen (TYLENOL) 325 MG tablet Take 2 tablets (650 mg total) by mouth every 4 (four) hours as needed for headache or mild pain.   aspirin EC 81 MG EC tablet Take 1 tablet (81 mg total) by mouth daily. Swallow whole.   atorvastatin (LIPITOR) 80 MG tablet Take 1 tablet (80 mg total) by mouth daily.   carvedilol (COREG) 6.25 MG tablet Take 1 tablet (6.25 mg  total) by mouth 2 (two) times daily.   clopidogrel (PLAVIX) 75 MG tablet Take 1 tablet (75 mg total) by mouth daily.   lisinopril (ZESTRIL) 40 MG tablet Take 1 tablet (40 mg total) by mouth daily.   predniSONE (STERAPRED UNI-PAK 48 TAB) 5 MG (48) TBPK tablet Take 5 mg by mouth as directed. Take once daily for 12 days      Allergies:   Codeine   Social History   Socioeconomic History   Marital status: Married    Spouse name: Not on file   Number of children: 1   Years of education: Not on file   Highest education level: Not on file   Occupational History   Not on file  Tobacco Use   Smoking status: Some Days    Pack years: 0.00   Smokeless tobacco: Never   Tobacco comments:    pt is trying to quit smoking  Vaping Use   Vaping Use: Never used  Substance and Sexual Activity   Alcohol use: Yes    Alcohol/week: 2.0 standard drinks    Types: 2 Cans of beer per week    Comment: 2-3 cans of beer a day   Drug use: Never   Sexual activity: Yes  Other Topics Concern   Not on file  Social History Narrative   Not on file   Social Determinants of Health   Financial Resource Strain: Not on file  Food Insecurity: Not on file  Transportation Needs: Not on file  Physical Activity: Not on file  Stress: Not on file  Social Connections: Not on file    Social: lives with her wife and occassionally with mother in law  Family History:  The patient's family history includes Breast cancer in her mother; Heart failure in her mother.  ROS:   Please see the history of present illness.  All other systems are reviewed and otherwise negative.    EKGs/Labs/Other Studies Reviewed:    Studies reviewed are outlined and summarized above. Reports included below if pertinent.  EKG:   11/10/20: SR 80 nonspecific TWI   Transthoracic Echocardiogram: Date: 10/13/20 Results:  1. Left ventricular ejection fraction, by estimation, is 55 to 60%. The  left ventricle has normal function. The left ventricle has no regional  wall motion abnormalities. Left ventricular diastolic parameters are  consistent with Grade II diastolic  dysfunction (pseudonormalization).   2. Right ventricular systolic function is normal. The right ventricular  size is normal. There is moderately elevated pulmonary artery systolic  pressure.   3. Left atrial size was mild to moderately dilated.   4. The mitral valve is normal in structure. Moderate mitral valve  regurgitation. No evidence of mitral stenosis.   5. The aortic valve is normal in structure.  Aortic valve regurgitation is  not visualized. No aortic stenosis is present.   Transesophageal Echocardiogram: Date: 10/16/20 Results: - Left Ventricle: The left ventricle is unchanged from pre-bypass.  - Right Ventricle: The right ventricle appears unchanged from pre-bypass.  - Aorta: The aorta appears unchanged from pre-bypass.  - Left Atrium: The left atrium appears unchanged from pre-bypass.  - Left Atrial Appendage: The left atrial appendage appears unchanged from  pre-bypass.  - Aortic Valve: The aortic valve appears unchanged from pre-bypass.  - Mitral Valve: The mitral valve appears unchanged from pre-bypass.  - Tricuspid Valve: The tricuspid valve appears unchanged from pre-bypass.  - Interatrial Septum: The interatrial septum appears unchanged from  pre-bypass.  - Interventricular Septum: The interventricular  septum appears unchanged  from  pre-bypass.  - Pericardium: The pericardium appears unchanged from pre-bypass.   PRE-OP FINDINGS   Left Ventricle: The left ventricle has normal systolic function, with an  ejection fraction of 55-60%. The cavity size was normal. There is no  increase in left ventricular wall thickness. There is no left ventricular  hypertrophy.    Right Ventricle: The right ventricle has normal systolic function. The  cavity was normal. There is no increase in right ventricular wall  thickness.   Left Atrium: Left atrial size was dilated. No left atrial/left atrial  appendage thrombus was detected.   Right Atrium: Right atrial size was normal in size.   Interatrial Septum: No atrial level shunt detected by color flow Doppler.   Pericardium: There is no evidence of pericardial effusion.   Mitral Valve: The mitral valve is normal in structure. Mitral valve  regurgitation is mild by color flow Doppler. There is No evidence of  mitral stenosis.   Tricuspid Valve: The tricuspid valve was normal in structure. Tricuspid  valve regurgitation was  not visualized by color flow Doppler.   Aortic Valve: The aortic valve is tricuspid Aortic valve regurgitation was  not visualized by color flow Doppler. There is no stenosis of the aortic  valve.    Pulmonic Valve: The pulmonic valve was not assessed.  Pulmonic valve regurgitation was not assessed by color flow Doppler.    Aorta: The aortic root, ascending aorta and aortic arch are normal in size  and structure.   Left/Right Heart Catheterizations: Date: 10/13/20 Results: Culprit lesion is a large branching ramus intermedius and has ostial to proximal narrowing without landing zone for PCI. Left main is widely patent LAD proximal to mid segment with intermediate stenosis in the 65 to 75% range documented to be hemodynamically significant by both RFR (0.82) and FFR (0.72). Circumflex is relatively small.  There is a moderate-sized third obtuse marginal.  The mid vessel contains eccentric 50 to 60% narrowing. RCA is dominant.  There is significant tortuosity.  The distal RCA before the PDA contains segmental 50% narrowing.  The ostial to proximal PDA is 60-70% narrowed. Overall LV function is normal with EF 60%.   RECOMMENDATIONS:   TCTS consultation. There is three-vessel coronary artery disease in this patient who smokes, has significant hypertension, is prediabetic, and hyperlipidemia. PCI on ramus intermedius would require overhang of stent into the distal left main.    Recent Labs: 10/14/2020: ALT 21 10/17/2020: Magnesium 2.3 11/10/2020: Hemoglobin 10.9; Platelets 249; TSH 0.830 11/16/2020: BUN 8; Creatinine, Ser 0.72; Potassium 4.5; Sodium 140  Recent Lipid Panel    Component Value Date/Time   CHOL 216 (H) 10/13/2020 0145   TRIG 82 10/13/2020 0145   HDL 66 10/13/2020 0145   CHOLHDL 3.3 10/13/2020 0145   VLDL 16 10/13/2020 0145   LDLCALC 134 (H) 10/13/2020 0145    PHYSICAL EXAM:    VS:  BP (!) 168/88   Pulse 64   Ht 5\' 3"  (1.6 m)   Wt 60.1 kg   SpO2 100%   BMI  23.49 kg/m    BMI: Body mass index is 23.49 kg/m.  GEN: Well nourished, well developed female in no acute distress  HEENT: normocephalic, atraumatic, bilateral Frank's Sign Neck: no JVD, carotid bruits Cardiac: RRR; no murmurs, rubs, or gallops, no edema  Respiratory:  clear to auscultation bilaterally, normal work of breathing GI: soft, nontender, nondistended, + BS MS: no deformity or atrophy Skin: warm and dry, no  rash, sternal scar and wounds c/d/i  including endoscopic sites, Bilateral leg bruises back of legs, small erythema in palms bilaterally Neuro:  Alert and Oriented x 3, Strength and sensation are intact, follows commands Psych: euthymic mood, full affect  Wt Readings from Last 3 Encounters:  01/06/21 60.1 kg  11/16/20 61.7 kg  11/10/20 63.3 kg     ASSESSMENT & PLAN:   CAD s/p CABG (LIMA to LAD, SVG to PDA, and SVG to Ramus Intermediate) Throat pain was anginal equivalent HLD- with follow up labs today HTN Mild MR with elevated RVSP on initially echo (asymptomatic) Rheumatoid Arthritis ? TB testing being positive unclear  - continue ASA 81 mg; Continue plavix until 10/16/21 - continue statin, goal LDL < 70 likely  Zetia 10 mg PO Daily and lipids and LFTs in three months; getting labs today - continue BB (coreg 6.26 mg PO daily - starting Imdur 30 mg PO Daily, discussed SE profile - continue ACEi (lisinopril 40 mg PO daily) - return to amb BP monitoring - discussed cardiac rehab will not be doing in (financial)  - if sternal would site pain becomes worse, will refer to pain mgmt  Time Spent Directly with Patient:   I have spent a total of 40 minutes with the patient reviewing notes, imaging, EKGs, labs and examining the patient as well as establishing an assessment and plan that was discussed personally with the patient.  > 50% of time was spent in direct patient care and wife discussing recovery from CABG, pain, exercise, and RA and CAD interplay.   Medication  Adjustments/Labs and Tests Ordered: Current medicines are reviewed at length with the patient today.  Concerns regarding medicines are outlined above. Medication changes, Labs and Tests ordered today are summarized above and listed in the Patient Instructions accessible in Encounters.   Signed, Christell Constant, MD  01/06/2021 9:40 AM    [facility] [address] [place], [place], [place]  [address] Phone: [phone]; Fax: [phone]

## 2021-01-06 ENCOUNTER — Encounter: Payer: Self-pay | Admitting: Internal Medicine

## 2021-01-06 ENCOUNTER — Other Ambulatory Visit: Payer: Self-pay

## 2021-01-06 ENCOUNTER — Ambulatory Visit (INDEPENDENT_AMBULATORY_CARE_PROVIDER_SITE_OTHER): Payer: 59 | Admitting: Internal Medicine

## 2021-01-06 VITALS — BP 168/88 | HR 64 | Ht 63.0 in | Wt 132.6 lb

## 2021-01-06 DIAGNOSIS — I2510 Atherosclerotic heart disease of native coronary artery without angina pectoris: Secondary | ICD-10-CM | POA: Diagnosis not present

## 2021-01-06 DIAGNOSIS — Z951 Presence of aortocoronary bypass graft: Secondary | ICD-10-CM

## 2021-01-06 DIAGNOSIS — E785 Hyperlipidemia, unspecified: Secondary | ICD-10-CM

## 2021-01-06 DIAGNOSIS — I10 Essential (primary) hypertension: Secondary | ICD-10-CM | POA: Diagnosis not present

## 2021-01-06 DIAGNOSIS — M069 Rheumatoid arthritis, unspecified: Secondary | ICD-10-CM

## 2021-01-06 LAB — LIPID PANEL
Chol/HDL Ratio: 2.2 ratio (ref 0.0–4.4)
Cholesterol, Total: 141 mg/dL (ref 100–199)
HDL: 65 mg/dL (ref >39)
LDL Chol Calc (NIH): 60 mg/dL (ref 0–99)
Triglycerides: 84 mg/dL (ref 0–149)
VLDL Cholesterol Cal: 16 mg/dL (ref 5–40)

## 2021-01-06 LAB — HEPATIC FUNCTION PANEL
ALT: 89 IU/L — ABNORMAL HIGH (ref 0–32)
AST: 29 IU/L (ref 0–40)
Albumin: 4.6 g/dL (ref 3.8–4.9)
Alkaline Phosphatase: 99 IU/L (ref 44–121)
Bilirubin Total: 0.2 mg/dL (ref 0.0–1.2)
Bilirubin, Direct: <0.1 mg/dL (ref 0.00–0.40)
Total Protein: 7 g/dL (ref 6.0–8.5)

## 2021-01-06 MED ORDER — CLOPIDOGREL BISULFATE 75 MG PO TABS: 75.0000 mg | ORAL_TABLET | Freq: Every day | ORAL | 3 refills | Status: DC

## 2021-01-06 MED ORDER — ATORVASTATIN CALCIUM 80 MG PO TABS: 80.0000 mg | ORAL_TABLET | Freq: Every day | ORAL | 3 refills | Status: DC

## 2021-01-06 MED ORDER — EZETIMIBE 10 MG PO TABS: 10.0000 mg | ORAL_TABLET | Freq: Every day | ORAL | 3 refills | Status: DC

## 2021-01-06 MED ORDER — LISINOPRIL 40 MG PO TABS: 40.0000 mg | ORAL_TABLET | Freq: Every day | ORAL | 3 refills | Status: DC

## 2021-01-06 MED ORDER — CARVEDILOL 6.25 MG PO TABS: 6.2500 mg | ORAL_TABLET | Freq: Two times a day (BID) | ORAL | 3 refills | Status: DC

## 2021-01-06 MED ORDER — ISOSORBIDE MONONITRATE ER 30 MG PO TB24
30.0000 mg | ORAL_TABLET | Freq: Every day | ORAL | 3 refills | Status: DC
Start: 2021-01-06 — End: 2021-01-13

## 2021-01-06 NOTE — Patient Instructions (Addendum)
Medication Instructions:  Your physician has recommended you make the following change in your medication:   START: isosorbide mononitrate (IMDUR) 30 mg by mouth daily  *If you need a refill on your cardiac medications before your next appointment, please call your pharmacy*   Lab Work: TODAY: fasting lipid panel and liver function test If you have labs (blood work) drawn today and your tests are completely normal, you will receive your results only by: MyChart Message (if you have MyChart) OR A paper copy in the mail If you have any lab test that is abnormal or we need to change your treatment, we will call you to review the results.   Testing/Procedures: NONE   Follow-Up: At [facility], you and your health needs are our priority.  As part of our continuing mission to provide you with exceptional heart care, we have created designated Provider Care Teams.  These Care Teams include your primary Cardiologist (physician) and Advanced Practice Providers (APPs -  Physician Assistants and Nurse Practitioners) who all work together to provide you with the care you need, when you need it.  We recommend signing up for the patient portal called "MyChart".  Sign up information is provided on this After Visit Summary.  MyChart is used to connect with patients for Virtual Visits (Telemedicine).  Patients are able to view lab/test results, encounter notes, upcoming appointments, etc.  Non-urgent messages can be sent to your provider as well.   To learn more about what you can do with MyChart, go to [email or web address].    Your next appointment:   3 month(s)  The format for your next appointment:   In Person  Provider:   You may see Christell Constant, MD or one of the following Advanced Practice Providers on your designated Care Team:   Ronie Spies, PA-C Jacolyn Reedy, PA-C

## 2021-01-07 ENCOUNTER — Telehealth: Payer: Self-pay

## 2021-01-07 NOTE — Telephone Encounter (Signed)
-----   Message from Christell Constant, MD sent at 01/07/2021  7:46 AM EDT ----- Results: Slight increase in ALT LDL at goal Plan: Repeat LFTs in two-three weeks; potential rosuvastatin switch  Christell Constant, MD

## 2021-01-07 NOTE — Telephone Encounter (Signed)
Called pt to schedule 2-3 week f/u lab work.  Pt is not willing to do labs at this time.  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  Per pt she has an appointment on 01/11/21 to discuss RA and TB treatment.  She will send in a my chart message to update me on plan of care.

## 2021-01-11 ENCOUNTER — Ambulatory Visit (INDEPENDENT_AMBULATORY_CARE_PROVIDER_SITE_OTHER): Payer: 59 | Admitting: Internal Medicine

## 2021-01-11 ENCOUNTER — Encounter: Payer: Self-pay | Admitting: Internal Medicine

## 2021-01-11 ENCOUNTER — Other Ambulatory Visit: Payer: Self-pay

## 2021-01-11 DIAGNOSIS — Z227 Latent tuberculosis: Secondary | ICD-10-CM | POA: Diagnosis not present

## 2021-01-11 NOTE — Progress Notes (Signed)
[facility]      Reason for Consult: latent Tb    Referring Physician: Dr. Nickola Major    Patient ID: April Bryant, female    DOB: 1961/01/30, 60 y.o.   MRN: [id number]  HPI:   She is here for evaluation of a positive Quantiferon Gold.   She has a history of rheumatoid arthritis and previously on Enbrel and considering change to humira after her recent CABG and MI due to drug drug interactions.  She had a routine Quantieferon Gold test and was positive.  She reports a previous negative ppd prior to starting enbrel.  No history of exposure to anyone she is aware of with active Tb.  No significant foreign travel.  Recent CXR during her recent visit with Dr. Cornelius Moras with no significant concerns, just some small bilateral pleural effusions.  She is having no significant night sweats, no weight loss.  Some cough but improved and has been since her recent CABG.  Accompanied by her wife.  Asks about recent liver enzymes and they have a young child with respiratory issues and if there are any concerns with being contagious.     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 Authorizing Provider  acetaminophen (TYLENOL) 325 MG tablet Take 2 tablets (650 mg total) by mouth every 4 (four) hours as needed for headache or mild pain. 10/20/20  Yes Leary Roca, PA-C  aspirin EC 81 MG EC tablet Take 1 tablet (81 mg total) by mouth daily. Swallow whole. 10/21/20  Yes Roddenberry, Cecille [place], PA-C  atorvastatin (LIPITOR) 80 MG tablet Take 1 tablet (80 mg total) by mouth daily. 01/06/21  Yes Chandrasekhar, Mahesh A, MD  carvedilol (COREG) 6.25 MG tablet Take 1 tablet  (6.25 mg total) by mouth 2 (two) times daily. 01/06/21  Yes Chandrasekhar, Mahesh A, MD  clopidogrel (PLAVIX) 75 MG tablet Take 1 tablet (75 mg total) by mouth daily. 01/06/21  Yes Chandrasekhar, Mahesh A, MD  ferrous fumarate-b12-vitamic C-folic acid (FEROCON) capsule Take 1 capsule by mouth daily.   Yes [provider]  isosorbide mononitrate (IMDUR) 30 MG 24 hr tablet Take 1 tablet (30 mg total) by mouth daily. 01/06/21  Yes Chandrasekhar, Mahesh A, MD  lisinopril (ZESTRIL) 40 MG tablet Take 1 tablet (40 mg total) by mouth daily. 01/06/21  Yes Chandrasekhar, Mahesh A, MD  ezetimibe (ZETIA) 10 MG tablet Take 10 mg by mouth daily. 01/06/21   [provider]  folic acid (FOLVITE) 1 MG tablet Take 1 mg by mouth daily. 01/07/21   [provider]  methotrexate (RHEUMATREX) 2.5 MG tablet Take 10 mg by mouth once a week. 01/07/21   [provider]  predniSONE (STERAPRED UNI-PAK 48 TAB) 5 MG (48) TBPK tablet Take 5 mg by mouth as directed. Take once daily for 12 days Patient not taking: Reported on 01/11/2021 12/25/20   [provider]    Allergies  Allergen Reactions   Codeine Anaphylaxis    Social History   Tobacco Use   Smoking status: Former    Pack  years: 0.00    Types: Cigarettes   Smokeless tobacco: Never   Tobacco comments:    pt is trying to quit smoking  Vaping Use   Vaping Use: Never used  Substance Use Topics   Alcohol use: Not Currently    Alcohol/week: 2.0 standard drinks    Types: 2 Cans of beer per week    Comment: 2-3 cans of beer a day   Drug use: Never    Family History  Problem Relation Age of Onset   Breast cancer Mother    Heart failure Mother      Review of Systems  Constitutional: negative for fevers, chills, sweats, fatigue, and weight loss Respiratory: negative for sputum, hemoptysis, or pneumonia Gastrointestinal: negative for nausea and diarrhea Integument/breast: negative for rash Hematologic/lymphatic: negative for  lymphadenopathy Musculoskeletal: negative for myalgias and arthralgias All other systems reviewed and are negative    Constitutional: in no apparent distress  Vitals:   01/11/21 0857  BP: (!) 172/109  Pulse: (!) 53  Temp: (!) 97.5 F (36.4 C)   EYES: anicteric ENMT: no thrush Cardiovascular: Cor RRR Respiratory: clear Musculoskeletal: no pedal edema noted Skin: negatives: no rash Neuro: non-focal  Labs: Lab Results  Component Value Date   WBC 6.7 11/10/2020   HGB 10.9 (L) 11/10/2020   HCT 36.0 11/10/2020   MCV 76 (L) 11/10/2020   PLT 249 11/10/2020    Lab Results  Component Value Date   CREATININE 0.72 11/16/2020   BUN 8 11/16/2020   NA 140 11/16/2020   K 4.5 11/16/2020   CL 102 11/16/2020   CO2 23 11/16/2020    Lab Results  Component Value Date   ALT 89 (H) 01/06/2021   AST 29 01/06/2021   ALKPHOS 99 01/06/2021   BILITOT 0.2 01/06/2021   INR 1.4 (H) 10/16/2020     Assessment: latent Tb.  She had a recent CXR that is negative for active Tb and no concerning symptoms.  Recent HIV test negative.  She is on multiple medications and will be starting Humira as soon as possible.  She is also starting multiple medications related to her recent MI and CABG and has had a recent increase in her ALT up to 89 compared to normal the week prior.  Concern it is statin-related.  At this point, I am going to recheck her ALT and if it is stable or better, will go ahead and start INH + B6.    Plan: 1)  INH and B6 once lab returns.  2) rtc in 4 weeks and repeat hepatic function panel or get result from other provider if already done Complete 6 months of INH B6 if doing well From an ID standpoint, ok to start Humira as soon as possible

## 2021-01-12 ENCOUNTER — Telehealth: Payer: Self-pay

## 2021-01-12 ENCOUNTER — Other Ambulatory Visit: Payer: Self-pay | Admitting: Internal Medicine

## 2021-01-12 LAB — HEPATIC FUNCTION PANEL
AG Ratio: 1.5 (calc) (ref 1.0–2.5)
ALT: 50 U/L — ABNORMAL HIGH (ref 6–29)
AST: 25 U/L (ref 10–35)
Albumin: 4.5 g/dL (ref 3.6–5.1)
Alkaline phosphatase (APISO): 101 U/L (ref 37–153)
Bilirubin, Direct: 0.1 mg/dL (ref 0.0–0.2)
Globulin: 3 g/dL (calc) (ref 1.9–3.7)
Indirect Bilirubin: 0.2 mg/dL (calc) (ref 0.2–1.2)
Total Bilirubin: 0.3 mg/dL (ref 0.2–1.2)
Total Protein: 7.5 g/dL (ref 6.1–8.1)

## 2021-01-12 MED ORDER — VITAMIN B-6 50 MG PO TABS: 50.0000 mg | ORAL_TABLET | Freq: Every day | ORAL | 5 refills | Status: DC

## 2021-01-12 MED ORDER — ISONIAZID 300 MG PO TABS: 300.0000 mg | ORAL_TABLET | Freq: Every day | ORAL | 5 refills | Status: DC

## 2021-01-12 NOTE — Telephone Encounter (Signed)
Left HIPAA compliant message requesting patient call back to review labs + medication.   Edvardo Honse Loyola Mast, RN

## 2021-01-12 NOTE — Telephone Encounter (Signed)
-----   Message from Esias Mory Jonathon Resides, [place] sent at 01/12/2021 12:11 PM EDT ----- I attempted to call patient to relay lab result and letting there know it is ok to start TB treatment. Patient did not answer and no secured VM setup.   ----- Message ----- From: Gardiner Barefoot, MD Sent: 01/12/2021  11:49 AM EDT To: [facility] Triage Nurse Pool  Please let her know that her hepatic function lab is improved compared to previous LFTs and she can start her latent Tb treatment with INH and B6 today.  This has been sent to her pharmacy.  thanks

## 2021-01-12 NOTE — Telephone Encounter (Signed)
Patient notified about lab results related to LFT's and prescriptions. Pharmacy reports they had to order the medications from warehouse. Should be ready tomorrow evening unless warehouse does not have them in stock. If warehouse does not have them, [facility] is to call office to notify team to change treatment plan.   Angelia Hazell Loyola Mast, RN

## 2021-01-12 NOTE — Telephone Encounter (Signed)
Old lab work received; AST/ALT were normal at that time (on statin) 12/22/20: ALT 16 AST 15  No change in statin.  Recommendation that she get follow up LFTs stands, but can get follow up with PC MD.  Riley Lam, MD Cardiologist [facility]  [address] [place], [address] [place], [place] [address] [phone]  8:01 AM

## 2021-01-13 MED ORDER — NITROGLYCERIN 0.4 MG SL SUBL: 0.4000 mg | SUBLINGUAL_TABLET | SUBLINGUAL | 3 refills | Status: DC | PRN

## 2021-01-13 NOTE — Telephone Encounter (Signed)
Discontinued imdur per pt request d/t potential side effects.  Ordered SL NTG per pt request to take PRN chest pain.  Instructions for medication use explained in my chart message.

## 2021-01-15 ENCOUNTER — Encounter: Payer: Self-pay | Admitting: Physician Assistant

## 2021-02-10 ENCOUNTER — Ambulatory Visit: Payer: 59 | Admitting: Internal Medicine

## 2021-03-07 ENCOUNTER — Other Ambulatory Visit: Payer: Self-pay | Admitting: Physician Assistant

## 2021-04-08 NOTE — Progress Notes (Signed)
Cardiology Office Note    Date:  04/09/2021   ID:  April Bryant, DOB Aug 22, 1960, MRN [id number]  PCP:  Farris Has, MD  Cardiologist:  Christell Constant, MD  Electrophysiologist:  None   CC: Follow up  HTN and CAD  History of Present Illness:    April Bryant is a 61 y.o. female with history of HTN, RA, tobacco abuse, and recent CAD with NSTEMI s/p CABG, HLD, mmild MR by TEE who presents for post-hospital follow-up. She was recently admitted with sudden onset of feeling like her throat was closing. She ruled in for NSTEMI (troponin >9k) and cath 10/13/20 showed MVCAD. 2D Echo EF 55-60%, grade 2 Dd, moderately elevated PASP, moderate MR, mild-mod LAE. Underwent CABGx3 on 10/16/20 with free LIMA-LAD, SVG-rI, SVG-PDA. Intra-op TEE showed only mild MR and no significant TR. Smoking cessation advised. Post-hospital course notable for expected ABL anemia and diuresis.  Seen 10/12/20.  In interim of this visit, patient saw PA D Dunn 11/10/20.  With this had persistent HTN. Seen 01/06/21 with residual CP, queries of TB, and persistent transaminitis (that appears to not be statin related).  Seen 04/09/21.  Patient notes that she is doing well.  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  Sternal pain has improved.  LFTS improved then got slight worse two weeks ago; off her methotrextate for about two weeks.  Now on Isoniazid since latent TB diagnosis- has had associated low blood pressure.  Blood pressure has come down to 10 mg PO daily since needing this medication.   Patient and family notes a few concerns:  staff issues regarding the prescription of Zetia and the overall cost of zetia.  Patient has the medication but has not started it.  We have discussed indications of  this medication as below.  No chest pain or pressure.  No SOB/DOE and no PND/Orthopnea.  No weight gain or leg swelling.  No palpitations or syncope.  Ambulatory blood pressure 122/83.  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   Free LIMA to LAD, SVG to Intermediate, SVG to PDA   Tobacco abuse     Past Surgical History:  Procedure Laterality Date   CORONARY ARTERY BYPASS GRAFT N/A 10/16/2020   Procedure: CORONARY ARTERY BYPASS GRAFTING (CABG), ON PUMP, TIMES THREE, USING LEFT INTERNAL MAMMARY ARTERY AND RIGHT ENDOSCOPICALLY HARVESTED GREATER SAPHENOUS VEIN;  Surgeon: Purcell Nails, MD;  Location: [facility] OR;  Service: Open Heart Surgery;  Laterality: N/A;   INTRAVASCULAR PRESSURE WIRE/FFR STUDY N/A 10/13/2020   Procedure: INTRAVASCULAR PRESSURE WIRE/FFR STUDY;  Surgeon: Lyn Records, MD;  Location: [facility] INVASIVE CV LAB;  Service: Cardiovascular;  Laterality: N/A;   LEFT HEART CATH AND CORONARY ANGIOGRAPHY N/A 10/13/2020   Procedure: LEFT HEART CATH AND CORONARY ANGIOGRAPHY;  Surgeon: Lyn Records, MD;  Location: [facility] INVASIVE CV LAB;  Service: Cardiovascular;  Laterality: N/A;   TEE WITHOUT CARDIOVERSION N/A 10/16/2020   Procedure: TRANSESOPHAGEAL ECHOCARDIOGRAM (TEE);  Surgeon: Purcell Nails, MD;  Location: [facility] OR;  Service: Open Heart Surgery;  Laterality: N/A;    Current Medications: Current Meds  Medication Sig   acetaminophen (TYLENOL) 325 MG tablet Take 2 tablets (650 mg total) by mouth every 4 (  four) hours as needed for headache or mild pain.   aspirin EC 81 MG EC tablet Take 1 tablet (81 mg total) by mouth daily. Swallow whole.   atorvastatin (LIPITOR) 80 MG tablet Take 1 tablet (80 mg total) by mouth daily.   carvedilol (COREG) 6.25 MG tablet Take 1 tablet (6.25 mg total) by mouth 2 (two) times daily.   clopidogrel (PLAVIX) 75 MG tablet Take 1 tablet (75 mg total) by mouth daily.   isoniazid (NYDRAZID) 300 MG tablet Take 1 tablet (300 mg total) by mouth daily.   nitroGLYCERIN (NITROSTAT) 0.4 MG SL  tablet Place 1 tablet (0.4 mg total) under the tongue every 5 (five) minutes as needed for chest pain. Can take up to 3 if you continue to have chest pain call 911   pyridOXINE (VITAMIN B-6) 50 MG tablet Take 1 tablet (50 mg total) by mouth daily.   [DISCONTINUED] lisinopril (ZESTRIL) 10 MG tablet Take 10 mg by mouth daily.   [DISCONTINUED] lisinopril (ZESTRIL) 30 MG tablet Take 30 mg by mouth daily.      Allergies:   Codeine   Social History   Socioeconomic History   Marital status: Married    Spouse name: Not on file   Number of children: 1   Years of education: Not on file   Highest education level: Not on file  Occupational History   Not on file  Tobacco Use   Smoking status: Former    Types: Cigarettes   Smokeless tobacco: Never   Tobacco comments:    pt is trying to quit smoking  Vaping Use   Vaping Use: Never used  Substance and Sexual Activity   Alcohol use: Not Currently    Alcohol/week: 2.0 standard drinks    Types: 2 Cans of beer per week    Comment: 2-3 cans of beer a day   Drug use: Never   Sexual activity: Yes  Other Topics Concern   Not on file  Social History Narrative   Not on file   Social Determinants of Health   Financial Resource Strain: Not on file  Food Insecurity: Not on file  Transportation Needs: Not on file  Physical Activity: Not on file  Stress: Not on file  Social Connections: Not on file    Social: lives with her wife and occassionally with mother in law  Family History:  The patient's family history includes Breast cancer in her mother; Heart failure in her mother.  ROS:   Please see the history of present illness.  All other systems are reviewed and otherwise negative.    EKGs/Labs/Other Studies Reviewed:    Studies reviewed are outlined and summarized above. Reports included below if pertinent.  EKG:   11/10/20: SR 80 nonspecific TWI   Transthoracic Echocardiogram: Date: 10/13/20 Results:  1. Left ventricular ejection  fraction, by estimation, is 55 to 60%. The  left ventricle has normal function. The left ventricle has no regional  wall motion abnormalities. Left ventricular diastolic parameters are  consistent with Grade II diastolic  dysfunction (pseudonormalization).   2. Right ventricular systolic function is normal. The right ventricular  size is normal. There is moderately elevated pulmonary artery systolic  pressure.   3. Left atrial size was mild to moderately dilated.   4. The mitral valve is normal in structure. Moderate mitral valve  regurgitation. No evidence of mitral stenosis.   5. The aortic valve is normal in structure. Aortic valve regurgitation is  not visualized. No aortic stenosis  is present.   Transesophageal Echocardiogram: Date: 10/16/20 Results: - Left Ventricle: The left ventricle is unchanged from pre-bypass.  - Right Ventricle: The right ventricle appears unchanged from pre-bypass.  - Aorta: The aorta appears unchanged from pre-bypass.  - Left Atrium: The left atrium appears unchanged from pre-bypass.  - Left Atrial Appendage: The left atrial appendage appears unchanged from  pre-bypass.  - Aortic Valve: The aortic valve appears unchanged from pre-bypass.  - Mitral Valve: The mitral valve appears unchanged from pre-bypass.  - Tricuspid Valve: The tricuspid valve appears unchanged from pre-bypass.  - Interatrial Septum: The interatrial septum appears unchanged from  pre-bypass.  - Interventricular Septum: The interventricular septum appears unchanged  from  pre-bypass.  - Pericardium: The pericardium appears unchanged from pre-bypass.   PRE-OP FINDINGS   Left Ventricle: The left ventricle has normal systolic function, with an  ejection fraction of 55-60%. The cavity size was normal. There is no  increase in left ventricular wall thickness. There is no left ventricular  hypertrophy.    Right Ventricle: The right ventricle has normal systolic function. The  cavity  was normal. There is no increase in right ventricular wall  thickness.   Left Atrium: Left atrial size was dilated. No left atrial/left atrial  appendage thrombus was detected.   Right Atrium: Right atrial size was normal in size.   Interatrial Septum: No atrial level shunt detected by color flow Doppler.   Pericardium: There is no evidence of pericardial effusion.   Mitral Valve: The mitral valve is normal in structure. Mitral valve  regurgitation is mild by color flow Doppler. There is No evidence of  mitral stenosis.   Tricuspid Valve: The tricuspid valve was normal in structure. Tricuspid  valve regurgitation was not visualized by color flow Doppler.   Aortic Valve: The aortic valve is tricuspid Aortic valve regurgitation was  not visualized by color flow Doppler. There is no stenosis of the aortic  valve.    Pulmonic Valve: The pulmonic valve was not assessed.  Pulmonic valve regurgitation was not assessed by color flow Doppler.    Aorta: The aortic root, ascending aorta and aortic arch are normal in size  and structure.   Left/Right Heart Catheterizations: Date: 10/13/20 Results: Culprit lesion is a large branching ramus intermedius and has ostial to proximal narrowing without landing zone for PCI. Left main is widely patent LAD proximal to mid segment with intermediate stenosis in the 65 to 75% range documented to be hemodynamically significant by both RFR (0.82) and FFR (0.72). Circumflex is relatively small.  There is a moderate-sized third obtuse marginal.  The mid vessel contains eccentric 50 to 60% narrowing. RCA is dominant.  There is significant tortuosity.  The distal RCA before the PDA contains segmental 50% narrowing.  The ostial to proximal PDA is 60-70% narrowed. Overall LV function is normal with EF 60%.   RECOMMENDATIONS:   TCTS consultation. There is three-vessel coronary artery disease in this patient who smokes, has significant hypertension, is  prediabetic, and hyperlipidemia. PCI on ramus intermedius would require overhang of stent into the distal left main.    Recent Labs: 10/17/2020: Magnesium 2.3 11/10/2020: Hemoglobin 10.9; Platelets 249; TSH 0.830 11/16/2020: BUN 8; Creatinine, Ser 0.72; Potassium 4.5; Sodium 140 01/11/2021: ALT 50  Recent Lipid Panel    Component Value Date/Time   CHOL 141 01/06/2021 1022   TRIG 84 01/06/2021 1022   HDL 65 01/06/2021 1022   CHOLHDL 2.2 01/06/2021 1022   CHOLHDL 3.3 10/13/2020 0145  VLDL 16 10/13/2020 0145   LDLCALC 60 01/06/2021 1022    PHYSICAL EXAM:    VS:  BP (!) 160/90   Pulse 65   Ht 5\' 3"  (1.6 m)   Wt 139 lb 12.8 oz (63.4 kg)   SpO2 99%   BMI 24.76 kg/m    BMI: Body mass index is 24.76 kg/m.  GEN: Well nourished, well developed female in no acute distress  HEENT: normocephalic, atraumatic, bilateral Frank's Sign Neck: no JVD, Cardiac: RRR; no murmurs, rubs, or gallops, no edema  Respiratory:  clear to auscultation bilaterally GI: soft, nontender, nondistended, + BS MS: no deformity or atrophy Skin: warm and dry Neuro:  Alert and Oriented x 3 Psych: euthymic mood, normal affect   ASSESSMENT & PLAN:   CAD s/p CABG (LIMA to LAD, SVG to PDA, and SVG to Ramus Intermediate) - Throat pain was anginal equivalent HLD- with follow up labs today HTN Mild MR with elevated RVSP on initially echo (asymptomatic) Rheumatoid Arthritis and latent TB - continue ASA 81 mg; Continue plavix until March visit - continue statin, goal LDL < 70; as of 01/06/21 patient has had LDL at goal last at 60 on only her statin.  Her LFTs were normal 6/22 despite statin; if concerns over statin related transaminitis lead to delay in her RA care; we can trial off her statin and she can start her Zetia 10 mg PO daily prescription  - continue BB (coreg 6.26 mg PO daily - patient has PRN nitro if needed - continue ACEi (lisinopril 10 mg PO daily since starting isoniazid) - continue  to amb BP  monitoring  Will CC Rheumatology, ID, and PCP given the statin question as above.  Will see in Late March 2023 before their annual road trip; in future happy to see patient in [place]  Medication Adjustments/Labs and Tests Ordered: Current medicines are reviewed at length with the patient today.  Concerns regarding medicines are outlined above. Medication changes, Labs and Tests ordered today are summarized above and listed in the Patient Instructions accessible in Encounters.   Signed, G[place], MD  04/09/2021 10:57 AM    [facility] [address] [place], [place], [place]  [place] Phone: [phone]; Fax: [phone]

## 2021-04-09 ENCOUNTER — Other Ambulatory Visit: Payer: Self-pay

## 2021-04-09 ENCOUNTER — Encounter: Payer: Self-pay | Admitting: Internal Medicine

## 2021-04-09 ENCOUNTER — Ambulatory Visit (INDEPENDENT_AMBULATORY_CARE_PROVIDER_SITE_OTHER): Payer: 59 | Admitting: Internal Medicine

## 2021-04-09 VITALS — BP 160/90 | HR 65 | Ht 63.0 in | Wt 139.8 lb

## 2021-04-09 DIAGNOSIS — E785 Hyperlipidemia, unspecified: Secondary | ICD-10-CM

## 2021-04-09 DIAGNOSIS — I10 Essential (primary) hypertension: Secondary | ICD-10-CM | POA: Diagnosis not present

## 2021-04-09 DIAGNOSIS — M069 Rheumatoid arthritis, unspecified: Secondary | ICD-10-CM

## 2021-04-09 DIAGNOSIS — Z227 Latent tuberculosis: Secondary | ICD-10-CM

## 2021-04-09 DIAGNOSIS — Z951 Presence of aortocoronary bypass graft: Secondary | ICD-10-CM | POA: Diagnosis not present

## 2021-04-09 MED ORDER — LISINOPRIL 10 MG PO TABS: 10.0000 mg | ORAL_TABLET | Freq: Every day | ORAL | 3 refills | Status: DC

## 2021-04-09 NOTE — Patient Instructions (Addendum)
Medication Instructions:  Your physician recommends that you continue on your current medications as directed. Please refer to the Current Medication list given to you today. We have sent in lisinopril 10 mg by mouth daily to your pharmacy  *If you need a refill on your cardiac medications before your next appointment, please call your pharmacy*   Lab Work: NONE If you have labs (blood work) drawn today and your tests are completely normal, you will receive your results only by: MyChart Message (if you have MyChart) OR A paper copy in the mail If you have any lab test that is abnormal or we need to change your treatment, we will call you to review the results.   Testing/Procedures: NONE   Follow-Up: At [facility], you and your health needs are our priority.  As part of our continuing mission to provide you with exceptional heart care, we have created designated Provider Care Teams.  These Care Teams include your primary Cardiologist (physician) and Advanced Practice Providers (APPs -  Physician Assistants and Nurse Practitioners) who all work together to provide you with the care you need, when you need it.   Your next appointment:   6 month(s)  The format for your next appointment:   In Person  Provider:   You may see Christell Constant, MD or one of the following Advanced Practice Providers on your designated Care Team:   Ronie Spies, PA-C Jacolyn Reedy, PA-C

## 2021-09-27 NOTE — Progress Notes (Signed)
? ?Cardiology Office Note   ? ?Date:  09/29/2021  ? ?ID:  [name], DOB 10/05/1960, MRN [id number] ? ?PCP:  London Pepper, MD  ?Cardiologist:  Werner Lean, MD  ?Electrophysiologist:  None  ? ?CC: Follow up  HTN and CAD ? ?History of Present Illness:  ?  ?April Bryant is a 61 y.o. female with history of HTN, RA, tobacco abuse, and recent CAD with NSTEMI s/p CABG, HLD, mild MR by TEE seen in 2022. 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 CABGx3 on 10/16/20 with free LIMA-LAD, SVG-rI, SVG-PDA. Intra-op TEE showed only mild MR and no significant TR. 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 ? ?Patient notes that she is doing well.   ?Took down and drain pip and fixed some siding.  Still feels some mild sternal pain. ?Doing Wii fit and yoga at least q 48 years. ?There are no interval hospital/ED visit.   ? ?No chest pain or pressure.  No throat pain and constriction, but has sinus issues.  No SOB/DOE and no PND/Orthopnea.  No weight gain or leg swelling.  No palpitations or syncope  No nitroglycerin. ? ?Since she was last here had new RA medication with increased;  ? ? ?Past Medical History:  ?Diagnosis Date  ? CAD in native artery   ? Hyperlipidemia   ? Hypertension   ? Mild mitral regurgitation   ? Pulmonary hypertension ([place])   ? Rheumatoid arthritis ([place])   ? 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 Free LIMA to LAD, SVG to Intermediate, SVG to PDA  ? Tobacco abuse   ? ? ?Past Surgical History:  ?Procedure Laterality Date  ? CORONARY ARTERY BYPASS GRAFT N/A 10/16/2020  ? Procedure: CORONARY ARTERY BYPASS GRAFTING (CABG), ON PUMP, TIMES THREE, USING LEFT INTERNAL MAMMARY ARTERY AND RIGHT ENDOSCOPICALLY HARVESTED GREATER SAPHENOUS VEIN;  Surgeon: Rexene Alberts, MD;  Location: [facility];  Service: Open Heart Surgery;  Laterality: N/A;  ? INTRAVASCULAR PRESSURE WIRE/FFR STUDY N/A 10/13/2020  ? Procedure: INTRAVASCULAR PRESSURE WIRE/FFR STUDY;  Surgeon: Belva Crome, MD;  Location: [facility];   Service: Cardiovascular;  Laterality: N/A;  ? LEFT HEART CATH AND CORONARY ANGIOGRAPHY N/A 10/13/2020  ? Procedure: LEFT HEART CATH AND CORONARY ANGIOGRAPHY;  Surgeon: Belva Crome, MD;  Location: [facility];  Service: Cardiovascular;  Laterality: N/A;  ? TEE WITHOUT CARDIOVERSION N/A 10/16/2020  ? Procedure: TRANSESOPHAGEAL ECHOCARDIOGRAM (TEE);  Surgeon: Rexene Alberts, MD;  Location: [facility];  Service: Open Heart Surgery;  Laterality: N/A;  ? ? ?Current Medications: ?Current Meds  ?Medication Sig  ? acetaminophen (TYLENOL) 325 MG tablet Take 2 tablets (650 mg total) by mouth every 4 (four) hours as needed for headache or mild pain.  ? Adalimumab (HUMIRA PEN) 40 MG/0.4ML PNKT every 14 (fourteen) days.  ? aspirin EC 81 MG EC tablet Take 1 tablet (81 mg total) by mouth daily. Swallow whole.  ? carvedilol (COREG) 12.5 MG tablet Take 1 tablet (12.5 mg total) by mouth 2 (two) times daily.  ? [DISCONTINUED] atorvastatin (LIPITOR) 80 MG tablet Take 1 tablet (80 mg total) by mouth daily.  ? [DISCONTINUED] carvedilol (COREG) 6.25 MG tablet Take 1 tablet (6.25 mg total) by mouth 2 (two) times daily.  ? [DISCONTINUED] clopidogrel (PLAVIX) 75 MG tablet Take 1 tablet (75 mg total) by mouth daily.  ? [DISCONTINUED] lisinopril (ZESTRIL) 40 MG tablet Take 40 mg by mouth daily.  ? [DISCONTINUED]  nitroGLYCERIN (NITROSTAT) 0.4 MG SL tablet Place 1 tablet (0.4 mg total) under the tongue every 5 (five) minutes as needed for chest pain. Can take up to 3 if you continue to have chest pain call 911  ? ?  ? ?Allergies:   Codeine, Diclofenac, Golimumab, and Hydrocodone-acetaminophen  ? ?Social History  ? ?Socioeconomic History  ? Marital status: Married  ?  Spouse name: Not on file  ? Number of children: 1  ? Years of education: Not on file  ? Highest education level: Not on file  ?Occupational History  ? Not on file  ?Tobacco Use  ? Smoking status: Former  ?  Types: Cigarettes  ? Smokeless tobacco: Never  ? Tobacco comments:  ?   pt is trying to quit smoking  ?Vaping Use  ? Vaping Use: Never used  ?Substance and Sexual Activity  ? Alcohol use: Not Currently  ?  Alcohol/week: 2.0 standard drinks  ?  Types: 2 Cans of beer per week  ?  Comment: 2-3 cans of beer a day  ? Drug use: Never  ? Sexual activity: Yes  ?Other Topics Concern  ? Not on file  ?Social History Narrative  ? Not on file  ? ?Social Determinants of Health  ? ?Financial Resource Strain: Not on file  ?Food Insecurity: Not on file  ?Transportation Needs: Not on file  ?Physical Activity: Not on file  ?Stress: Not on file  ?Social Connections: Not on file  ?  ?Social: lives with her wife and occassionally with mother in law ? ?Family History:  ?The patient's family history includes Breast cancer in her mother; Heart failure in her mother. ? ?ROS:   ?Please see the history of present illness.  ?All other systems are reviewed and otherwise negative.  ? ? ?EKGs/Labs/Other Studies Reviewed:   ? ?Studies reviewed are outlined and summarized above. Reports included below if pertinent. ? ?EKG:   ?11/10/20: SR 80 nonspecific TWI  ? ?Transthoracic Echocardiogram: ?Date: 10/13/20 ?Results: ? 1. Left ventricular ejection fraction, by estimation, is 55 to 60%. The  ?left ventricle has normal function. The left ventricle has no regional  ?wall motion abnormalities. Left ventricular diastolic parameters are  ?consistent with Grade II diastolic  ?dysfunction (pseudonormalization).  ? 2. Right ventricular systolic function is normal. The right ventricular  ?size is normal. There is moderately elevated pulmonary artery systolic  ?pressure.  ? 3. Left atrial size was mild to moderately dilated.  ? 4. The mitral valve is normal in structure. Moderate mitral valve  ?regurgitation. No evidence of mitral stenosis.  ? 5. The aortic valve is normal in structure. Aortic valve regurgitation is  ?not visualized. No aortic stenosis is present.  ? ?Transesophageal Echocardiogram: ?Date: 10/16/20 ?Results: ?- Left  Ventricle: The left ventricle is unchanged from pre-bypass.  ?- Right Ventricle: The right ventricle appears unchanged from pre-bypass.  ?- Aorta: The aorta appears unchanged from pre-bypass.  ?- Left Atrium: The left atrium appears unchanged from pre-bypass.  ?- Left Atrial Appendage: The left atrial appendage appears unchanged from  ?pre-bypass.  ?- Aortic Valve: The aortic valve appears unchanged from pre-bypass.  ?- Mitral Valve: The mitral valve appears unchanged from pre-bypass.  ?- Tricuspid Valve: The tricuspid valve appears unchanged from pre-bypass.  ?- Interatrial Septum: The interatrial septum appears unchanged from  ?pre-bypass.  ?- Interventricular Septum: The interventricular septum appears unchanged  ?from  ?pre-bypass.  ?- Pericardium: The pericardium appears unchanged from pre-bypass.  ? ?PRE-OP FINDINGS  ? Left  Ventricle: The left ventricle has normal systolic function, with an  ?ejection fraction of 55-60%. The cavity size was normal. There is no  ?increase in left ventricular wall thickness. There is no left ventricular  ?hypertrophy.  ? ? ?Right Ventricle: The right ventricle has normal systolic function. The  ?cavity was normal. There is no increase in right ventricular wall  ?thickness.  ? ?Left Atrium: Left atrial size was dilated. No left atrial/left atrial  ?appendage thrombus was detected.  ? ?Right Atrium: Right atrial size was normal in size.  ? ?Interatrial Septum: No atrial level shunt detected by color flow Doppler.  ? ?Pericardium: There is no evidence of pericardial effusion.  ? ?Mitral Valve: The mitral valve is normal in structure. Mitral valve  ?regurgitation is mild by color flow Doppler. There is No evidence of  ?mitral stenosis.  ? ?Tricuspid Valve: The tricuspid valve was normal in structure. Tricuspid  ?valve regurgitation was not visualized by color flow Doppler.  ? ?Aortic Valve: The aortic valve is tricuspid Aortic valve regurgitation was  ?not visualized by color flow  Doppler. There is no stenosis of the aortic  ?valve.  ? ? ?Pulmonic Valve: The pulmonic valve was not assessed.  ?Pulmonic valve regurgitation was not assessed by color flow Doppler.  ? ? ?Aorta: The ao

## 2021-09-29 ENCOUNTER — Other Ambulatory Visit: Payer: Self-pay

## 2021-09-29 ENCOUNTER — Encounter: Payer: Self-pay | Admitting: Internal Medicine

## 2021-09-29 ENCOUNTER — Ambulatory Visit (INDEPENDENT_AMBULATORY_CARE_PROVIDER_SITE_OTHER): Payer: Self-pay | Admitting: Internal Medicine

## 2021-09-29 VITALS — BP 167/97 | HR 73 | Ht 64.0 in | Wt 153.0 lb

## 2021-09-29 DIAGNOSIS — E785 Hyperlipidemia, unspecified: Secondary | ICD-10-CM

## 2021-09-29 DIAGNOSIS — M069 Rheumatoid arthritis, unspecified: Secondary | ICD-10-CM

## 2021-09-29 DIAGNOSIS — I2510 Atherosclerotic heart disease of native coronary artery without angina pectoris: Secondary | ICD-10-CM | POA: Insufficient documentation

## 2021-09-29 MED ORDER — ATORVASTATIN CALCIUM 80 MG PO TABS: 80.0000 mg | ORAL_TABLET | Freq: Every day | ORAL | 3 refills | Status: DC

## 2021-09-29 MED ORDER — LISINOPRIL 40 MG PO TABS: 40.0000 mg | ORAL_TABLET | Freq: Every day | ORAL | 3 refills | Status: DC

## 2021-09-29 MED ORDER — CARVEDILOL 12.5 MG PO TABS: 12.5000 mg | ORAL_TABLET | Freq: Two times a day (BID) | ORAL | 3 refills | Status: DC

## 2021-09-29 MED ORDER — NITROGLYCERIN 0.4 MG SL SUBL: 0.4000 mg | SUBLINGUAL_TABLET | SUBLINGUAL | 3 refills | Status: DC | PRN

## 2021-09-29 NOTE — Patient Instructions (Addendum)
Medication Instructions:  ?Your physician has recommended you make the following change in your medication:  ?INCREASE: carvedilol (Coreg) to 12.5 mg by mouth twice daily ?STOP: clopidogrel (Plavix) ?REFILLED: atorvastatin, lisinopril and nitroglycerin ? ?*If you need a refill on your cardiac medications before your next appointment, please call your pharmacy* ? ? ?Lab Work: ?TODAY: FLP, ALT, CBCD, CBC ?If you have labs (blood work) drawn today and your tests are completely normal, you will receive your results only by: ?MyChart Message (if you have MyChart) OR ?A paper copy in the mail ?If you have any lab test that is abnormal or we need to change your treatment, we will call you to review the results. ? ? ?Testing/Procedures: ?NONE ? ? ?Follow-Up: ?At [facility], you and your health needs are our priority.  As part of our continuing mission to provide you with exceptional heart care, we have created designated Provider Care Teams.  These Care Teams include your primary Cardiologist (physician) and Advanced Practice Providers (APPs -  Physician Assistants and Nurse Practitioners) who all work together to provide you with the care you need, when you need it. ? ? ?Your next appointment:   ?1 year(s) ? ?The format for your next appointment:   ?In Person ? ?Provider:   ?Riley Lam, MD   ? ?

## 2021-09-30 LAB — COMPREHENSIVE METABOLIC PANEL
ALT: 30 IU/L (ref 0–32)
AST: 27 IU/L (ref 0–40)
Albumin/Globulin Ratio: 2.2 (ref 1.2–2.2)
Albumin: 4.6 g/dL (ref 3.8–4.8)
Alkaline Phosphatase: 76 IU/L (ref 44–121)
BUN/Creatinine Ratio: 14 (ref 12–28)
BUN: 10 mg/dL (ref 8–27)
Bilirubin Total: 0.2 mg/dL (ref 0.0–1.2)
CO2: 29 mmol/L (ref 20–29)
Calcium: 9.5 mg/dL (ref 8.7–10.3)
Chloride: 106 mmol/L (ref 96–106)
Creatinine, Ser: 0.73 mg/dL (ref 0.57–1.00)
Globulin, Total: 2.1 g/dL (ref 1.5–4.5)
Glucose: 103 mg/dL — ABNORMAL HIGH (ref 70–99)
Potassium: 4.3 mmol/L (ref 3.5–5.2)
Sodium: 144 mmol/L (ref 134–144)
Total Protein: 6.7 g/dL (ref 6.0–8.5)
eGFR: 94 mL/min/{1.73_m2} (ref >59)

## 2021-09-30 LAB — CBC WITH DIFFERENTIAL/PLATELET
Basophils Absolute: 0 10*3/uL (ref 0.0–0.2)
Basos: 1 %
EOS (ABSOLUTE): 0.2 10*3/uL (ref 0.0–0.4)
Eos: 5 %
Hematocrit: 41.9 % (ref 34.0–46.6)
Hemoglobin: 13.3 g/dL (ref 11.1–15.9)
Immature Grans (Abs): 0 10*3/uL (ref 0.0–0.1)
Immature Granulocytes: 0 %
Lymphocytes Absolute: 2.2 10*3/uL (ref 0.7–3.1)
Lymphs: 45 %
MCH: 23.5 pg — ABNORMAL LOW (ref 26.6–33.0)
MCHC: 31.7 g/dL (ref 31.5–35.7)
MCV: 74 fL — ABNORMAL LOW (ref 79–97)
Monocytes Absolute: 0.4 10*3/uL (ref 0.1–0.9)
Monocytes: 7 %
Neutrophils Absolute: 2 10*3/uL (ref 1.4–7.0)
Neutrophils: 42 %
Platelets: 135 10*3/uL — ABNORMAL LOW (ref 150–450)
RBC: 5.65 x10E6/uL — ABNORMAL HIGH (ref 3.77–5.28)
RDW: 14 % (ref 11.7–15.4)
WBC: 4.9 10*3/uL (ref 3.4–10.8)

## 2021-09-30 LAB — LIPID PANEL
Chol/HDL Ratio: 2.3 ratio (ref 0.0–4.4)
Cholesterol, Total: 159 mg/dL (ref 100–199)
HDL: 68 mg/dL (ref >39)
LDL Chol Calc (NIH): 65 mg/dL (ref 0–99)
Triglycerides: 156 mg/dL — ABNORMAL HIGH (ref 0–149)
VLDL Cholesterol Cal: 26 mg/dL (ref 5–40)

## 2022-02-27 IMAGING — CR DG CHEST 1V PORT SAME DAY
1 series · 1 of 1 positions shown · non-contrast
Comparison: 10/15/2020

CLINICAL DATA: Post CABG.  Assess for retained foreign body.

EXAM:
PORTABLE CHEST 1 VIEW

[AP]
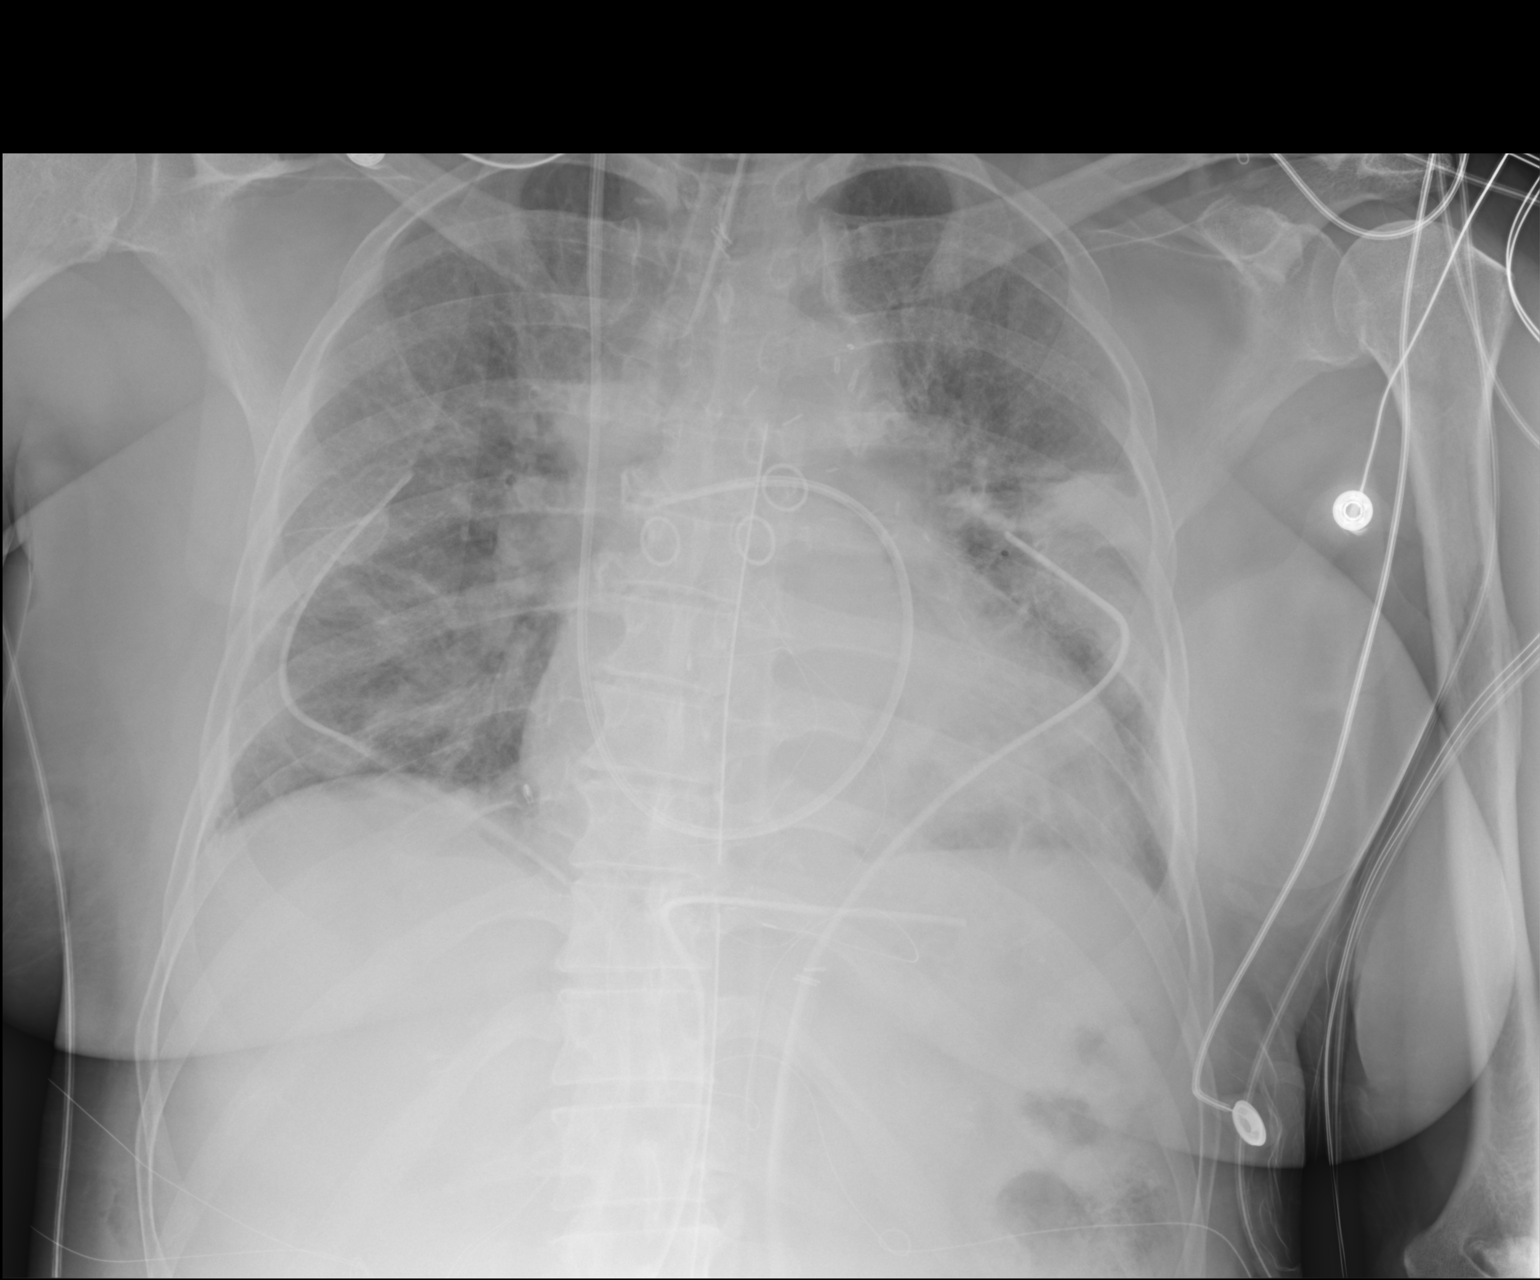

[1 of 1 positions shown; findings below may reference images not displayed]

FINDINGS: Endotracheal tube has tip 2.6 cm above the carina. Right IJ
Swan-Ganz catheter has tip over the right main pulmonary artery.
Bilateral chest tubes as well as mediastinal drain/pericardial
drains in place.

Lungs are adequately inflated demonstrate focal opacification over
the left midlung likely atelectasis. There is subtle hazy prominence
of the perihilar vessels likely mild vascular congestion. No
significant effusion or pneumothorax. Cardiomediastinal silhouette
and remainder the exam is unchanged. No evidence of radiopaque
foreign body to suggest retained needles.
IMPRESSION: 1. Mild focal opacification left midlung likely atelectasis.
Suggestion of mild vascular congestion.
2. Multiple tubes and lines as described. No evidence of retained
needles.

## 2022-02-28 IMAGING — DX DG CHEST 1V PORT
1 series · 1 of 1 positions shown · non-contrast
Comparison: Chest x-ray dated 10/16/2020.

CLINICAL DATA: Status post CABG x3.  History of hypertension.

EXAM:
PORTABLE CHEST 1 VIEW

[chest]
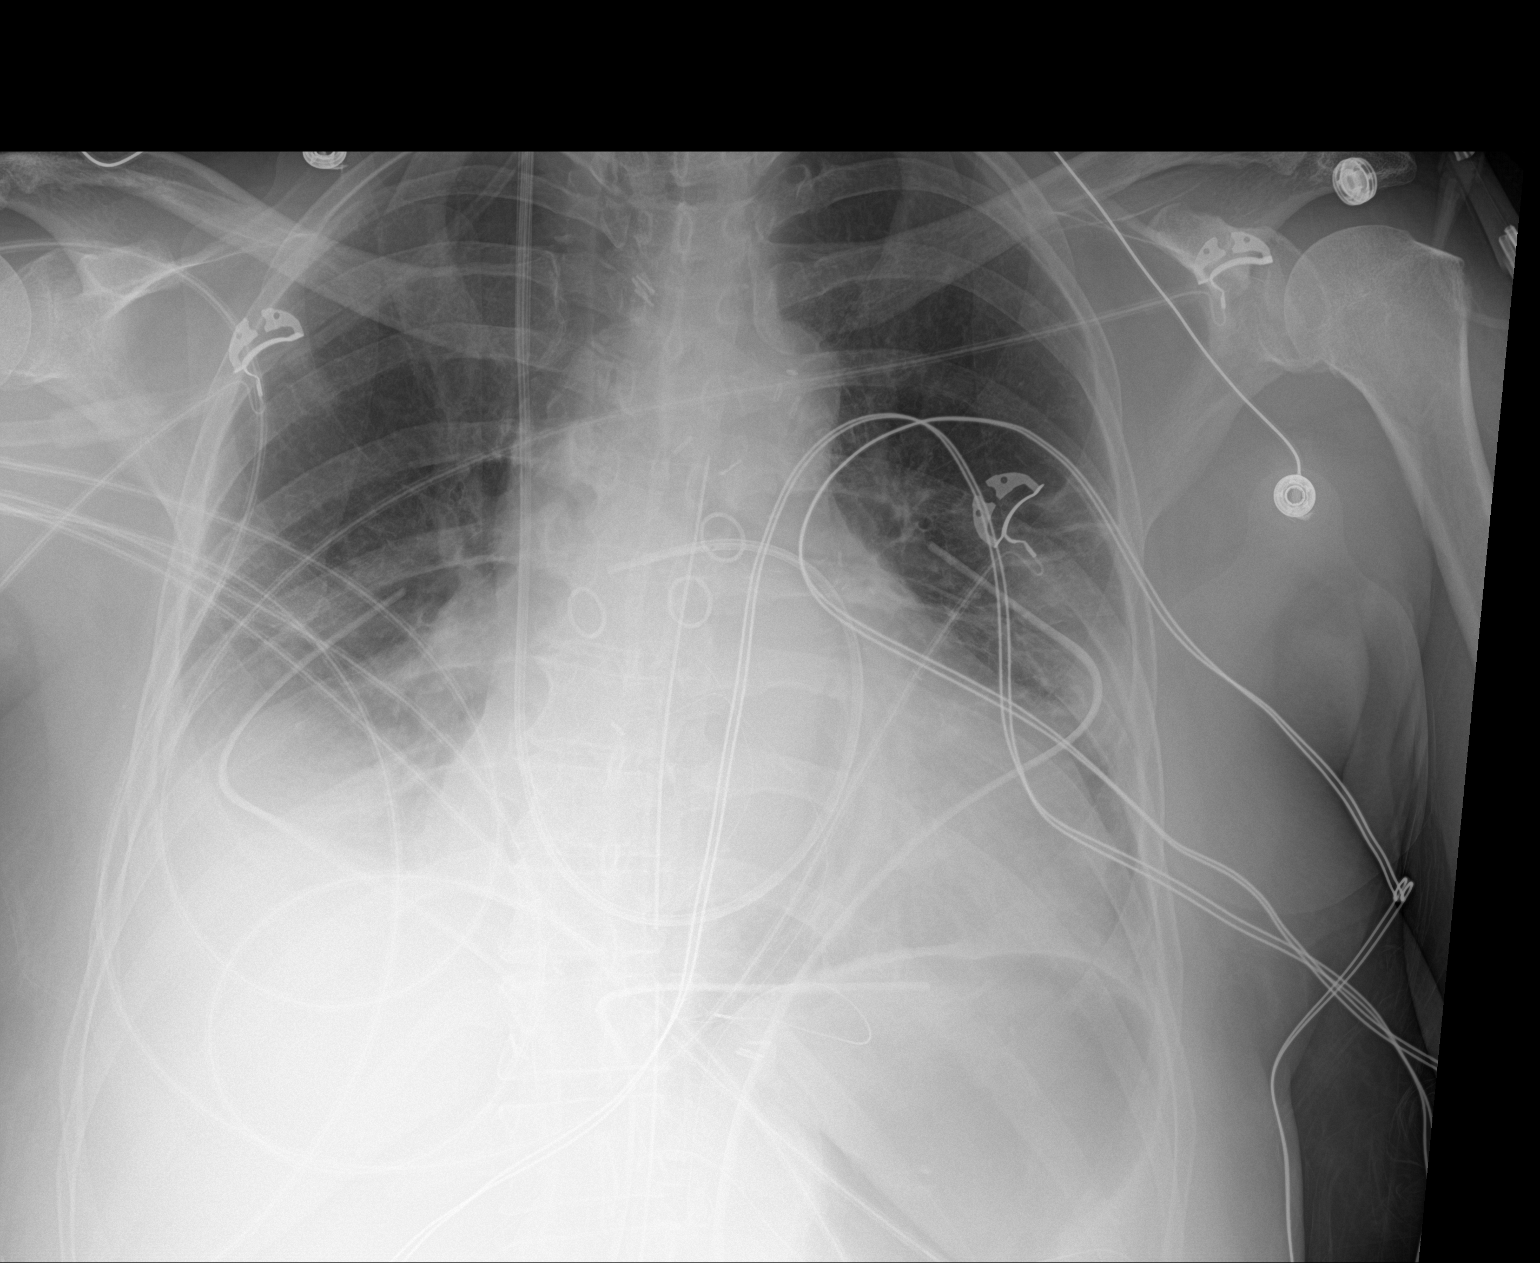

[1 of 1 positions shown; findings below may reference images not displayed]

FINDINGS: Endotracheal tube and nasogastric tube have been removed. Swan-Ganz
catheter is stable in position with tip just to the RIGHT of
midline. Mediastinal drain remains in place. Bilateral chest tubes
appear stable in position.

Stable mild bibasilar atelectasis and/or small pleural effusions.
Lungs otherwise clear. No pneumothorax is seen.
IMPRESSION: 1. Endotracheal tube and nasogastric tube have been removed.
2. Remaining support apparatus appears stable in position.
3. Stable mild bibasilar atelectasis and/or small pleural effusions.
Lungs otherwise clear. No pneumothorax seen.

## 2022-03-01 IMAGING — DX DG CHEST 1V PORT
1 series · 1 of 1 positions shown · non-contrast
Comparison: Chest x-rays dated 10/17/2020 and 10/16/2020.

CLINICAL DATA: Open heart surgery.

EXAM:
PORTABLE CHEST 1 VIEW

[chest]
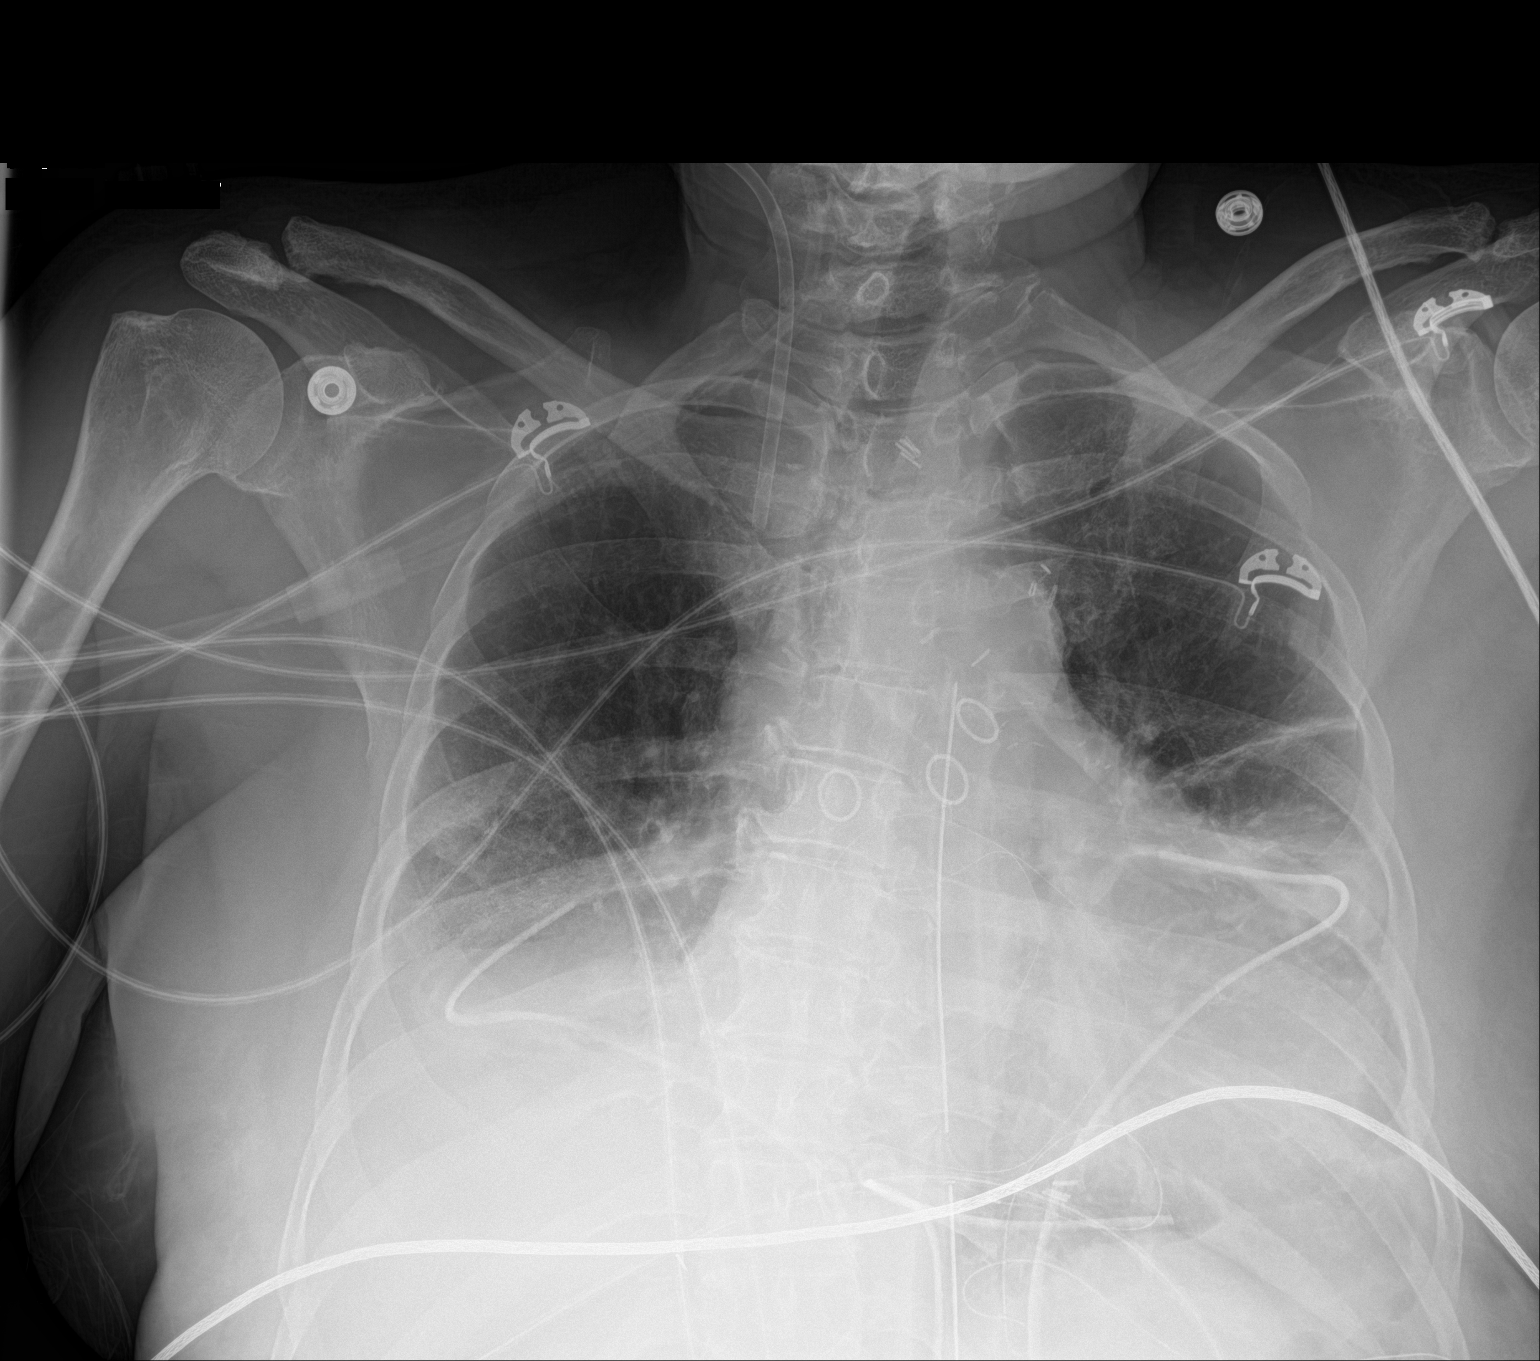

[1 of 1 positions shown; findings below may reference images not displayed]

FINDINGS: Swan-Ganz catheter has been removed. RIGHT IJ Cordis is stable in
position. Bibasilar chest tubes are grossly stable in position.

Stable bibasilar opacities, atelectasis and/or small pleural
effusions. No new lung findings. No pneumothorax is seen.
IMPRESSION: 1. Interval removal of the Swan-Ganz catheter. Remaining support
apparatus appears appropriately positioned.
2. Stable bibasilar opacities, compatible with atelectasis and/or
small pleural effusions.

## 2022-03-02 IMAGING — CR DG CHEST 2V
2 series · 2 of 2 positions shown · non-contrast
Comparison: 10/18/2020

CLINICAL DATA: Post cardiac surgery

EXAM:
CHEST - 2 VIEW

[chest pa]
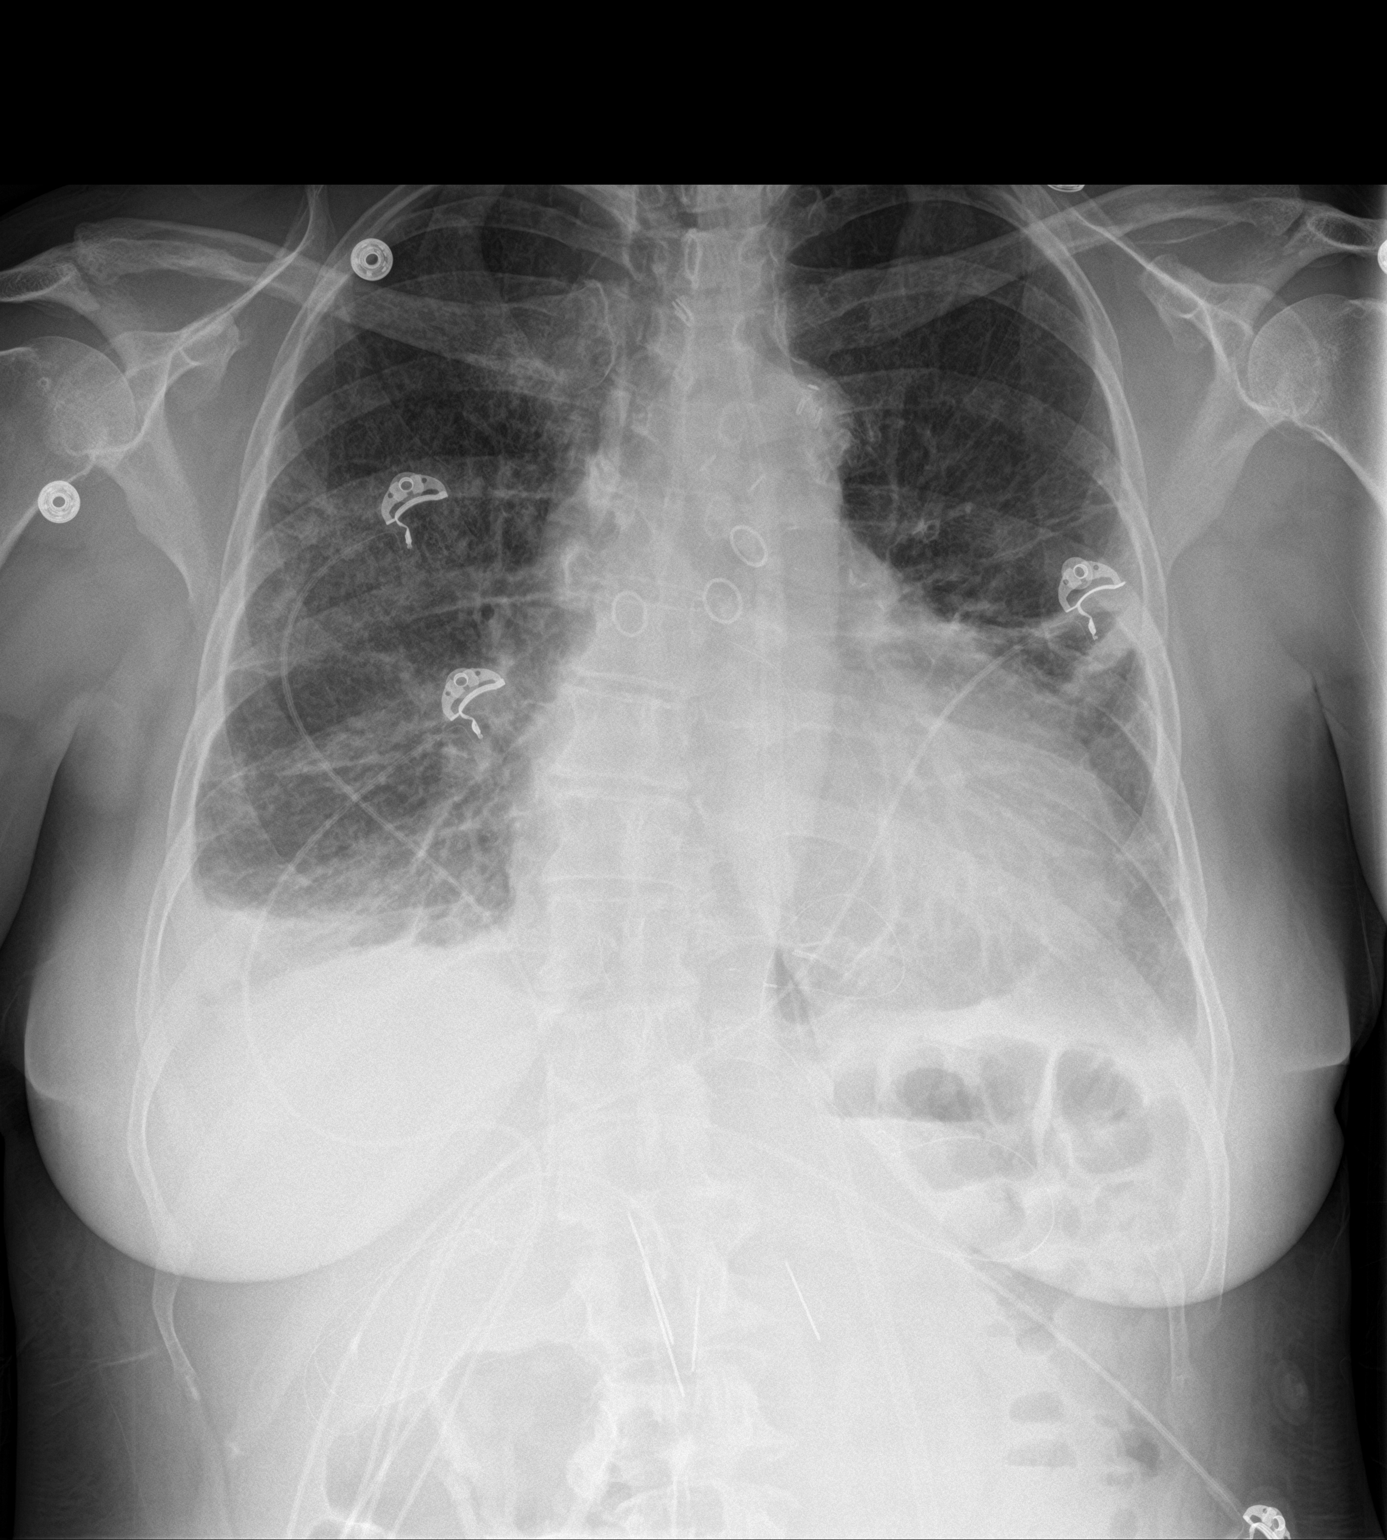

[chest lat]
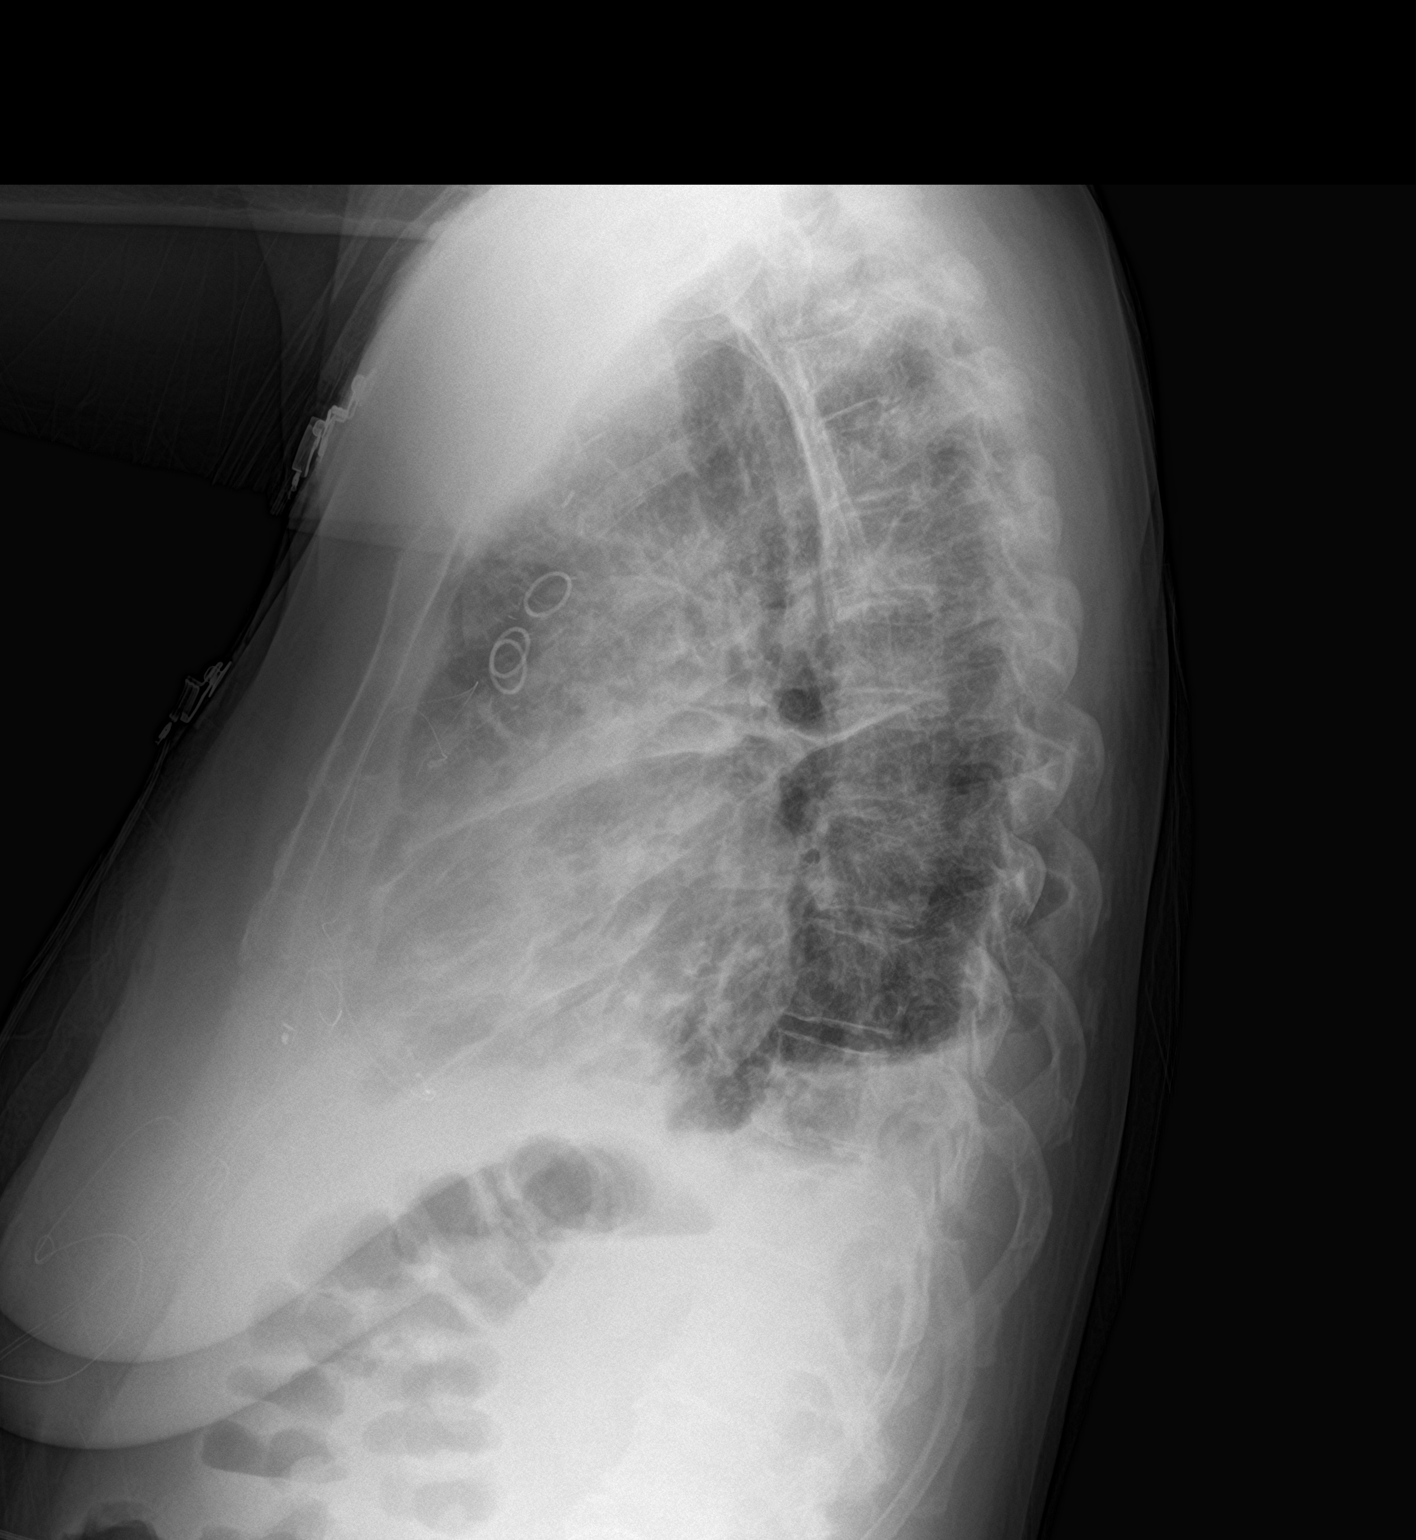

[2 of 2 positions shown; findings below may reference images not displayed]

FINDINGS: Small bilateral pleural effusions and adjacent atelectasis. Probable
mild interstitial edema. No pneumothorax. No remaining lines or
tubes. Similar cardiomediastinal contours.
IMPRESSION: Small bilateral pleural effusions and adjacent atelectasis. Probable
mild interstitial edema.

## 2022-09-26 NOTE — Progress Notes (Unsigned)
Cardiology Office Note    Date:  09/27/2022   ID:  April Bryant, DOB 1960-08-27, MRN [id number]  PCP:  London Pepper, MD  Cardiologist:  Werner Lean, MD  Electrophysiologist:  None   CC: CAD  History of Present Illness:    April Bryant is a 62 y.o. female with history of HTN, RA, tobacco abuse, and recent CAD with NSTEMI s/p CABG, HLD, mild MR by TEE seen in 2022. 2022:   NSTEMI (troponin >9k) and cath 10/13/20 showed MVCAD. CABGx3 on 10/16/20 with free LIMA-LAD, SVG-rI, SVG-PDA. Intra-op TEE showed only mild MR and no significant TR. 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 2023: back to normal physical activity; getting therapy with rheum  Patient notes that she is doing well.   Since last visit notes that she is getting back into yard work.   Allergies and keeping her from outdoor activity.  There are no interval hospital/ED visit.    No chest pain or pressure. No Throat pain.  No SOB/DOE and no PND/Orthopnea.  No weight gain or leg swelling.  No palpitations or syncope .  Has been smoke free.    She has some gingival bleeding and sometimes has to hold her ASA (~ q48 hours).  She is pending dental surgery.  Ambulatory blood pressure 160/90.    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   Free LIMA to LAD, SVG to Intermediate, SVG to PDA   Tobacco abuse     Past Surgical History:  Procedure Laterality Date   CORONARY ARTERY BYPASS GRAFT N/A 10/16/2020   Procedure: CORONARY ARTERY BYPASS GRAFTING (CABG), ON PUMP, TIMES THREE, USING LEFT INTERNAL MAMMARY ARTERY AND RIGHT ENDOSCOPICALLY HARVESTED GREATER SAPHENOUS VEIN;  Surgeon: Rexene Alberts, MD;  Location: [place];  Service: Open Heart Surgery;  Laterality: N/A;   INTRAVASCULAR PRESSURE WIRE/FFR STUDY N/A 10/13/2020   Procedure:  INTRAVASCULAR PRESSURE WIRE/FFR STUDY;  Surgeon: Belva Crome, MD;  Location: [place] [facility];  Service: Cardiovascular;  Laterality: N/A;   LEFT HEART CATH AND CORONARY ANGIOGRAPHY N/A 10/13/2020   Procedure: LEFT HEART CATH AND CORONARY ANGIOGRAPHY;  Surgeon: Belva Crome, MD;  Location: [place] [facility];  Service: Cardiovascular;  Laterality: N/A;   TEE WITHOUT CARDIOVERSION N/A 10/16/2020   Procedure: TRANSESOPHAGEAL ECHOCARDIOGRAM (TEE);  Surgeon: Rexene Alberts, MD;  Location: [place];  Service: Open Heart Surgery;  Laterality: N/A;    Current Medications: Current Meds  Medication Sig   acetaminophen (TYLENOL) 325 MG tablet Take 2 tablets (650 mg total) by mouth every 4 (four) hours as needed for headache or mild pain.   Adalimumab (HUMIRA PEN) 40 MG/0.4ML PNKT every 14 (fourteen) days.   aspirin EC 81 MG EC tablet Take 1 tablet (81 mg total) by mouth daily. Swallow whole.   atorvastatin (LIPITOR) 80 MG tablet Take 1 tablet (80 mg total) by mouth daily.   carvedilol (COREG) 12.5 MG tablet Take 1 tablet (12.5 mg total) by mouth 2 (two) times daily.   lisinopril (ZESTRIL) 40 MG tablet Take 1 tablet (40 mg total) by mouth daily.   nitroGLYCERIN (NITROSTAT) 0.4 MG SL tablet Place 1 tablet (0.4 mg total) under the tongue every 5 (five) minutes as needed for chest  pain. Can take up to 3 if you continue to have chest pain call 911      Allergies:   Codeine and Diclofenac   Social History   Socioeconomic History   Marital status: Married    Spouse name: Not on file   Number of children: 1   Years of education: Not on file   Highest education level: Not on file  Occupational History   Not on file  Tobacco Use   Smoking status: Former    Types: Cigarettes   Smokeless tobacco: Never   Tobacco comments:    pt is trying to quit smoking  Vaping Use   Vaping Use: Never used  Substance and Sexual Activity   Alcohol use: Not Currently    Alcohol/week: 2.0 standard drinks of  alcohol    Types: 2 Cans of beer per week    Comment: 2-3 cans of beer a day   Drug use: Never   Sexual activity: Yes  Other Topics Concern   Not on file  Social History Narrative   Not on file   Social Determinants of Health   Financial Resource Strain: Not on file  Food Insecurity: Not on file  Transportation Needs: Not on file  Physical Activity: Not on file  Stress: Not on file  Social Connections: Not on file    Social: lives with her wife and occassionally with mother in law; she had heart surgery  Family History:  The patient's family history includes Breast cancer in her mother; Heart failure in her mother.  ROS:   Please see the history of present illness.  All other systems are reviewed and otherwise negative.    EKGs/Labs/Other Studies Reviewed:    Studies reviewed are outlined and summarized above. Reports included below if pertinent.  EKG:   09/27/22: Sinus bradycardia LAE, inferolateral TWI 11/10/20: SR 80 nonspecific TWI   Cardiac Studies & Procedures   CARDIAC CATHETERIZATION  CARDIAC CATHETERIZATION 10/13/2020  Narrative  Culprit lesion is a large branching ramus intermedius and has ostial to proximal narrowing without landing zone for PCI.  Left main is widely patent  LAD proximal to mid segment with intermediate stenosis in the 65 to 75% range documented to be hemodynamically significant by both RFR (0.82) and FFR (0.72).  Circumflex is relatively small.  There is a moderate-sized third obtuse marginal.  The mid vessel contains eccentric 50 to 60% narrowing.  RCA is dominant.  There is significant tortuosity.  The distal RCA before the PDA contains segmental 50% narrowing.  The ostial to proximal PDA is 60-70% narrowed.  Overall LV function is normal with EF 60%.  RECOMMENDATIONS:   TCTS consultation.  There is three-vessel coronary artery disease in this patient who smokes, has significant hypertension, is prediabetic, and  hyperlipidemia.  PCI on ramus intermedius would require overhang of stent into the distal left main.  Findings Coronary Findings Diagnostic  Dominance: Right  Left Anterior Descending The vessel exhibits minimal luminal irregularities. Ost LAD to Prox LAD lesion is 75% stenosed.  Ramus Intermedius Vessel is large. Ramus lesion is 95% stenosed.  Left Circumflex Vessel is small. Prox Cx to Mid Cx lesion is 60% stenosed.  First Obtuse Marginal Branch Vessel is small in size.  Right Coronary Artery There is moderate diffuse disease throughout the vessel. Dist RCA lesion is 50% stenosed.  Right Posterior Descending Artery RPDA lesion is 75% stenosed.  Intervention  No interventions have been documented.     ECHOCARDIOGRAM  ECHOCARDIOGRAM COMPLETE  10/13/2020  Narrative ECHOCARDIOGRAM REPORT    Patient Name:   DELSY MCCOURY Date of Exam: 10/13/2020 Medical Rec #:  [id number]        Height:       63.0 in Accession #:    [id number]       Weight:       144.0 lb Date of Birth:  [birth year]        BSA:          1.682 m Patient Age:    42 years         BP:           182/94 mmHg Patient Gender: F                HR:           60 bpm. Exam Location:  Inpatient  Procedure: 2D Echo, Cardiac Doppler and Color Doppler  Indications:    NSTEMI  History:        Patient has no prior history of Echocardiogram examinations. Risk Factors:Hypertension.  Sonographer:    [place] Referring Phys: [id number] Leanor Kail   Sonographer Comments: 10/13/20 cath IMPRESSIONS   1. Left ventricular ejection fraction, by estimation, is 55 to 60%. The left ventricle has normal function. The left ventricle has no regional wall motion abnormalities. Left ventricular diastolic parameters are consistent with Grade II diastolic dysfunction (pseudonormalization). 2. Right ventricular systolic function is normal. The right ventricular size is normal. There is moderately elevated  pulmonary artery systolic pressure. 3. Left atrial size was mild to moderately dilated. 4. The mitral valve is normal in structure. Moderate mitral valve regurgitation. No evidence of mitral stenosis. 5. The aortic valve is normal in structure. Aortic valve regurgitation is not visualized. No aortic stenosis is present.  FINDINGS Left Ventricle: Left ventricular ejection fraction, by estimation, is 55 to 60%. The left ventricle has normal function. The left ventricle has no regional wall motion abnormalities. The left ventricular internal cavity size was normal in size. There is no left ventricular hypertrophy. Left ventricular diastolic parameters are consistent with Grade II diastolic dysfunction (pseudonormalization).  Right Ventricle: The right ventricular size is normal. No increase in right ventricular wall thickness. Right ventricular systolic function is normal. There is moderately elevated pulmonary artery systolic pressure. The tricuspid regurgitant velocity is 3.76 m/s, and with an assumed right atrial pressure of 3 mmHg, the estimated right ventricular systolic pressure is 0000000 mmHg.  Left Atrium: Left atrial size was mild to moderately dilated.  Right Atrium: Right atrial size was normal in size.  Pericardium: There is no evidence of pericardial effusion.  Mitral Valve: The mitral valve is normal in structure. Moderate mitral valve regurgitation. No evidence of mitral valve stenosis.  Tricuspid Valve: The tricuspid valve is normal in structure. Tricuspid valve regurgitation is not demonstrated. No evidence of tricuspid stenosis.  Aortic Valve: The aortic valve is normal in structure. Aortic valve regurgitation is not visualized. Aortic regurgitation PHT measures 730 msec. No aortic stenosis is present. Aortic valve mean gradient measures 5.0 mmHg. Aortic valve peak gradient measures 8.9 mmHg. Aortic valve area, by VTI measures 1.42 cm.  Pulmonic Valve: The pulmonic valve was  normal in structure. Pulmonic valve regurgitation is not visualized.  Aorta: The aortic root and ascending aorta are structurally normal, with no evidence of dilitation.  IAS/Shunts: The atrial septum is grossly normal.   LEFT VENTRICLE PLAX 2D LVIDd:         4.60 cm  Diastology LVIDs:         2.80 cm     LV e' medial:    5.87 cm/s LV PW:         1.10 cm     LV E/e' medial:  15.8 LV IVS:        1.40 cm     LV e' lateral:   4.57 cm/s LVOT diam:     2.00 cm     LV E/e' lateral: 20.3 LV SV:         47 LV SV Index:   28 LVOT Area:     3.14 cm  LV Volumes (MOD) LV vol d, MOD A2C: 68.9 ml LV vol d, MOD A4C: 83.3 ml LV vol s, MOD A2C: 41.1 ml LV vol s, MOD A4C: 53.5 ml LV SV MOD A2C:     27.8 ml LV SV MOD A4C:     83.3 ml LV SV MOD BP:      27.4 ml   PULMONARY VEINS A Reversal Duration: 108.00 msec A Reversal Velocity: [id number] cm/s Diastolic Velocity:  [id number] cm/s S/D Velocity:        0000000 Systolic Velocity:   [id number] cm/s  LEFT ATRIUM             Index       RIGHT ATRIUM          Index LA diam:        4.00 cm 2.38 cm/m  RA Area:     8.92 cm LA Vol (A2C):   49.1 ml 29.20 ml/m RA Volume:   18.50 ml 11.00 ml/m LA Vol (A4C):   92.7 ml 55.12 ml/m LA Biplane Vol: 68.1 ml 40.50 ml/m AORTIC VALVE                    PULMONIC VALVE AV Area (Vmax):    1.58 cm     PV Vmax:       0.95 m/s AV Area (Vmean):   1.58 cm     PV Vmean:      72.700 cm/s AV Area (VTI):     1.42 cm     PV VTI:        0.222 m AV Vmax:           149.00 cm/s  PV Peak grad:  3.6 mmHg AV Vmean:          103.400 cm/s PV Mean grad:  2.0 mmHg AV VTI:            0.332 m AV Peak Grad:      8.9 mmHg AV Mean Grad:      5.0 mmHg LVOT Vmax:         74.75 cm/s LVOT Vmean:        52.100 cm/s LVOT VTI:          0.150 m LVOT/AV VTI ratio: 0.45 AI PHT:            730 msec  AORTA Ao Root diam: 3.30 cm Ao Asc diam:  2.90 cm  MITRAL VALVE                 TRICUSPID VALVE MV Area (PHT): 3.50 cm      TR Peak grad:    56.6 mmHg MV Decel Time: 217 msec      TR Vmax:        376.00 cm/s MR Peak grad:    124.3 mmHg MR Mean grad:  84.5 mmHg   SHUNTS MR Vmax:         557.50 cm/s Systemic VTI:  0.15 m MR Vmean:        441.0 cm/s  Systemic Diam: 2.00 cm MR PISA:         0.77 cm MR PISA Eff ROA: 3 mm MR PISA Radius:  0.35 cm MV E velocity: 92.70 cm/s MV A velocity: 112.00 cm/s MV E/A ratio:  0.83  Mertie Moores MD Electronically signed by Mertie Moores MD Signature Date/Time: 10/13/2020/11:52:15 AM    Final   TEE  ECHO INTRAOPERATIVE TEE 10/16/2020  Narrative *INTRAOPERATIVE TRANSESOPHAGEAL REPORT *    Patient Name:   PHYLLYS PSENCIK Date of Exam: 10/16/2020 Medical Rec #:  [id number]        Height:       63.0 in Accession #:    [id number]       Weight:       142.8 lb Date of Birth:  03-Nov-1960        BSA:          1.68 m Patient Age:    75 years         BP:           185/105 mmHg Patient Gender: F                HR:           76 bpm. Exam Location:  Anesthesiology  Transesophogeal exam was perform intraoperatively during surgical procedure. Patient was closely monitored under general anesthesia during the entirety of examination.  Indications:     Coronary Artery Disease Sonographer:     Bernadene Person RDCS Performing Phys: Renold Don MD Diagnosing Phys: Renold Don MD  Complications: No known complications during this procedure. POST-OP IMPRESSIONS - Left Ventricle: The left ventricle is unchanged from pre-bypass. - Right Ventricle: The right ventricle appears unchanged from pre-bypass. - Aorta: The aorta appears unchanged from pre-bypass. - Left Atrium: The left atrium appears unchanged from pre-bypass. - Left Atrial Appendage: The left atrial appendage appears unchanged from pre-bypass. - Aortic Valve: The aortic valve appears unchanged from pre-bypass. - Mitral Valve: The mitral valve appears unchanged from pre-bypass. - Tricuspid Valve: The tricuspid valve appears  unchanged from pre-bypass. - Interatrial Septum: The interatrial septum appears unchanged from pre-bypass. - Interventricular Septum: The interventricular septum appears unchanged from pre-bypass. - Pericardium: The pericardium appears unchanged from pre-bypass.  PRE-OP FINDINGS Left Ventricle: The left ventricle has normal systolic function, with an ejection fraction of 55-60%. The cavity size was normal. There is no increase in left ventricular wall thickness. There is no left ventricular hypertrophy.   Right Ventricle: The right ventricle has normal systolic function. The cavity was normal. There is no increase in right ventricular wall thickness.  Left Atrium: Left atrial size was dilated. No left atrial/left atrial appendage thrombus was detected.  Right Atrium: Right atrial size was normal in size.  Interatrial Septum: No atrial level shunt detected by color flow Doppler.  Pericardium: There is no evidence of pericardial effusion.  Mitral Valve: The mitral valve is normal in structure. Mitral valve regurgitation is mild by color flow Doppler. There is No evidence of mitral stenosis.  Tricuspid Valve: The tricuspid valve was normal in structure. Tricuspid valve regurgitation was not visualized by color flow Doppler.  Aortic Valve: The aortic valve is tricuspid Aortic valve regurgitation was not visualized by color flow Doppler. There is no stenosis of the aortic valve.  Pulmonic Valve: The pulmonic valve was not assessed. Pulmonic valve regurgitation was not assessed by color flow Doppler.   Aorta: The aortic root, ascending aorta and aortic arch are normal in size and structure.   Renold Don MD Electronically signed by Renold Don MD Signature Date/Time: 10/16/2020/12:04:47 PM    Final               Recent Labs: 09/29/2021: ALT 30; BUN 10; Creatinine, Ser 0.73; Hemoglobin 13.3; Platelets 135; Potassium 4.3; Sodium 144  Recent Lipid Panel    Component Value  Date/Time   CHOL 159 09/29/2021 1045   TRIG 156 (H) 09/29/2021 1045   HDL 68 09/29/2021 1045   CHOLHDL 2.3 09/29/2021 1045   CHOLHDL 3.3 10/13/2020 0145   VLDL 16 10/13/2020 0145   LDLCALC 65 09/29/2021 1045    PHYSICAL EXAM:    VS:  BP (!) 162/100   Pulse (!) 55   Ht 5' 2.5" (1.588 m)   Wt 161 lb 3.2 oz (73.1 kg)   SpO2 97%   BMI 29.01 kg/m    BMI: Body mass index is 29.01 kg/m.  Gen: no distress,    Neck: No JVD Cardiac: No Rubs or Gallops, systolic Murmur, Regular bradycardia, +2 radial pulses Respiratory: Clear to auscultation bilaterally, normal effort, normal  respiratory rate GI: Soft, nontender, non-distended  MS: No  edema;  moves all extremities Integument: Skin feels warm Neuro:  At time of evaluation, alert and oriented to person/place/time/situation  Psych: Normal affect, patient feels ok   ASSESSMENT & PLAN:   CAD s/p CABG (LIMA to LAD, SVG to PDA, and SVG to Ramus Intermediate) - Throat pain was anginal equivalent HLD and HTN - continue ASA 81 mg; can hold as needed for her upcoming dental procedure; does not meet indications for ABX PPX - continue statin, goal LDL < 70;  fasting lipids today - continue coreg to 12.5 - start norvasc 5 - HTN clinic in one month; continue AMB monitoring - patient has PRN nitro if needed (will refill) - on lisiniopril 40 with refills  Mitral regurgitation Mild at time of bypass - echo in 08/2023  RF Rheumatoid Arthritis and latent TB - Needs CBC w diff, CRP, and CMP today, will order and fax to Fax  Dr. Gavin Pound  [phone]  One year f/u me   Medication Adjustments/Labs and Tests Ordered: Current medicines are reviewed at length with the patient today.  Concerns regarding medicines are outlined above. Medication changes, Labs and Tests ordered today are summarized above and listed in the Patient Instructions accessible in Encounters.   Rudean Haskell, MD [address], [address] [place], [place] [address] [phone]  10:15 AM

## 2022-09-27 ENCOUNTER — Ambulatory Visit: Payer: Self-pay | Attending: Internal Medicine | Admitting: Internal Medicine

## 2022-09-27 ENCOUNTER — Encounter: Payer: Self-pay | Admitting: Internal Medicine

## 2022-09-27 VITALS — BP 162/100 | HR 55 | Ht 62.5 in | Wt 161.2 lb

## 2022-09-27 DIAGNOSIS — E785 Hyperlipidemia, unspecified: Secondary | ICD-10-CM

## 2022-09-27 DIAGNOSIS — I10 Essential (primary) hypertension: Secondary | ICD-10-CM

## 2022-09-27 DIAGNOSIS — I2510 Atherosclerotic heart disease of native coronary artery without angina pectoris: Secondary | ICD-10-CM

## 2022-09-27 DIAGNOSIS — M069 Rheumatoid arthritis, unspecified: Secondary | ICD-10-CM

## 2022-09-27 DIAGNOSIS — Z951 Presence of aortocoronary bypass graft: Secondary | ICD-10-CM

## 2022-09-27 DIAGNOSIS — I340 Nonrheumatic mitral (valve) insufficiency: Secondary | ICD-10-CM

## 2022-09-27 LAB — LIPID PANEL
Chol/HDL Ratio: 2.4 ratio (ref 0.0–4.4)
Cholesterol, Total: 188 mg/dL (ref 100–199)
HDL: 79 mg/dL (ref >39)
LDL Chol Calc (NIH): 89 mg/dL (ref 0–99)
Triglycerides: 113 mg/dL (ref 0–149)
VLDL Cholesterol Cal: 20 mg/dL (ref 5–40)

## 2022-09-27 LAB — ALT: ALT: 31 IU/L (ref 0–32)

## 2022-09-27 MED ORDER — ATORVASTATIN CALCIUM 80 MG PO TABS: 80.0000 mg | ORAL_TABLET | Freq: Every day | ORAL | 3 refills | Status: DC

## 2022-09-27 MED ORDER — CARVEDILOL 12.5 MG PO TABS: 12.5000 mg | ORAL_TABLET | Freq: Two times a day (BID) | ORAL | 3 refills | Status: DC

## 2022-09-27 MED ORDER — AMLODIPINE BESYLATE 5 MG PO TABS: 5.0000 mg | ORAL_TABLET | Freq: Every day | ORAL | 3 refills | Status: DC

## 2022-09-27 MED ORDER — LISINOPRIL 40 MG PO TABS: 40.0000 mg | ORAL_TABLET | Freq: Every day | ORAL | 3 refills | Status: DC

## 2022-09-27 MED ORDER — NITROGLYCERIN 0.4 MG SL SUBL: 0.4000 mg | SUBLINGUAL_TABLET | SUBLINGUAL | 3 refills | Status: AC | PRN

## 2022-09-27 NOTE — Patient Instructions (Addendum)
Medication Instructions:  Your physician has recommended you make the following change in your medication:  START: amlodipine (Norvasc) 5 mg by mouth once daily REFILLED: Nitroglycerin, atorvastatin, lisinopril and carvedilol   *If you need a refill on your cardiac medications before your next appointment, please call your pharmacy*   Lab Work: TODAY: CRP, CBC, CMP, FLP, ALT  If you have labs (blood work) drawn today and your tests are completely normal, you will receive your results only by: [place] (if you have MyChart) OR A paper copy in the mail If you have any lab test that is abnormal or we need to change your treatment, we will call you to review the results.   Testing/Procedures: FEB 2025: Your physician has requested that you have an echocardiogram. Echocardiography is a painless test that uses sound waves to create images of your heart. It provides your doctor with information about the size and shape of your heart and how well your heart's chambers and valves are working. This procedure takes approximately one hour. There are no restrictions for this procedure. Please do NOT wear cologne, perfume, aftershave, or lotions (deodorant is allowed). Please arrive 15 minutes prior to your appointment time.   IN 1 MONTH: Your physician has referred you to the Pharmacy Clinic for Hypertension.    Follow-Up: At [facility], you and your health needs are our priority.  As part of our continuing mission to provide you with exceptional heart care, we have created designated Provider Care Teams.  These Care Teams include your primary Cardiologist (physician) and Advanced Practice Providers (APPs -  Physician Assistants and Nurse Practitioners) who all work together to provide you with the care you need, when you need it.  We recommend signing up for the patient portal called "MyChart".  Sign up information is provided on this After Visit Summary.  MyChart is used to  connect with patients for Virtual Visits (Telemedicine).  Patients are able to view lab/test results, encounter notes, upcoming appointments, etc.  Non-urgent messages can be sent to your provider as well.   To learn more about what you can do with MyChart, go to [email or web address].    Your next appointment:   1 year(s)  Provider:   Werner Lean, MD

## 2022-09-27 NOTE — Addendum Note (Signed)
Addended by: Precious Gilding on: 09/27/2022 10:48 AM   Modules accepted: Orders

## 2022-10-04 ENCOUNTER — Telehealth: Payer: Self-pay

## 2022-10-04 DIAGNOSIS — E785 Hyperlipidemia, unspecified: Secondary | ICD-10-CM

## 2022-10-04 DIAGNOSIS — Z951 Presence of aortocoronary bypass graft: Secondary | ICD-10-CM

## 2022-10-04 MED ORDER — EZETIMIBE 10 MG PO TABS: 10.0000 mg | ORAL_TABLET | Freq: Every day | ORAL | 3 refills | Status: DC

## 2022-10-04 NOTE — Telephone Encounter (Signed)
-----   Message from Werner Lean, MD sent at 10/02/2022  9:12 PM EDT ----- Results: Normal ALT LDL above goal As per protocol, did not received hre other results as they were under her rheumatologist's orders Plan: Zetia 10 mg and labs in three months  Werner Lean, MD

## 2022-10-04 NOTE — Telephone Encounter (Signed)
The patient has been notified of the result and verbalized understanding.  All questions (if any) were answered. Bedelia Person Teodora Baumgarten, RN 10/04/2022 2:27 PM   Pt will have fasting labs drawn in 3 months at [facility] for [place] placed and released for draw.

## 2022-10-27 ENCOUNTER — Encounter: Payer: Self-pay | Admitting: Internal Medicine

## 2022-12-17 ENCOUNTER — Other Ambulatory Visit: Payer: Self-pay | Admitting: Internal Medicine

## 2022-12-19 ENCOUNTER — Other Ambulatory Visit: Payer: Self-pay | Admitting: *Deleted

## 2022-12-19 MED ORDER — LISINOPRIL 40 MG PO TABS: 40.0000 mg | ORAL_TABLET | Freq: Every day | ORAL | 3 refills | Status: DC

## 2022-12-19 MED ORDER — CARVEDILOL 12.5 MG PO TABS: 12.5000 mg | ORAL_TABLET | Freq: Two times a day (BID) | ORAL | 3 refills | Status: DC

## 2023-03-24 LAB — LAB REPORT - SCANNED
EGFR: 72
HM Hepatitis Screen: NEGATIVE

## 2023-08-03 ENCOUNTER — Other Ambulatory Visit: Payer: Self-pay | Admitting: Internal Medicine

## 2023-08-23 ENCOUNTER — Ambulatory Visit (HOSPITAL_COMMUNITY): Payer: Self-pay

## 2023-09-06 ENCOUNTER — Other Ambulatory Visit (HOSPITAL_COMMUNITY): Payer: Self-pay

## 2023-09-27 ENCOUNTER — Ambulatory Visit (HOSPITAL_COMMUNITY): Payer: Self-pay | Attending: Cardiology

## 2023-09-27 DIAGNOSIS — E785 Hyperlipidemia, unspecified: Secondary | ICD-10-CM | POA: Insufficient documentation

## 2023-09-27 DIAGNOSIS — I10 Essential (primary) hypertension: Secondary | ICD-10-CM | POA: Insufficient documentation

## 2023-09-27 DIAGNOSIS — I340 Nonrheumatic mitral (valve) insufficiency: Secondary | ICD-10-CM | POA: Insufficient documentation

## 2023-09-27 DIAGNOSIS — Z951 Presence of aortocoronary bypass graft: Secondary | ICD-10-CM | POA: Insufficient documentation

## 2023-09-27 LAB — ECHOCARDIOGRAM COMPLETE
Area-P 1/2: 3.5 cm2
P 1/2 time: 521 ms
S' Lateral: 2.8 cm

## 2023-09-28 ENCOUNTER — Encounter: Payer: Self-pay | Admitting: Internal Medicine

## 2023-10-02 ENCOUNTER — Telehealth: Payer: Self-pay | Admitting: Internal Medicine

## 2023-10-02 MED ORDER — ATORVASTATIN CALCIUM 80 MG PO TABS: 80.0000 mg | ORAL_TABLET | Freq: Every day | ORAL | 0 refills | Status: DC

## 2023-10-02 NOTE — Telephone Encounter (Signed)
 Pt called in to report has had 2 recent visits with rheumatology and was told BP fine 120/80, 120/90.  Reports ankle and hand swelling thinks could be r/t amlodipine.  Pt does not want to increase medication d/t reported stable BP and swelling.  Advised pt will send to MD to advise.  Would like to let MD know spouse passed away recently which has caused a lot of added stress.   Pt also requesting refills of medications advised is overdue for OV.  Scheduled OV for 11/23/23 at 11:40 am.  Sent in refill for atorvastatin 80 mg pt reports is not taking Zetia.

## 2023-10-02 NOTE — Telephone Encounter (Signed)
I did not need this encounter. °

## 2023-10-02 NOTE — Telephone Encounter (Signed)
-----   Message from Nurse Estelle Grumbles sent at 09/29/2023  9:01 AM EDT -----  ----- Message ----- From: Christell Constant, MD Sent: 09/28/2023   9:59 AM EDT To: [facility] Triage  Results: Interventricular septal hypertrophy of 16 mm on echocardiogram; function is normal.  Mild mitral regurgitation Mild pulmonic insufficiency - Strain acquisition is foreshortened but likely low normal - compared to preop imaging; hypertrophy has increased  Plan: Increase norvasc dose to 10 mg.  Return to blood pressure logs  Christell Constant, MD

## 2023-10-04 ENCOUNTER — Encounter: Payer: Self-pay | Admitting: Internal Medicine

## 2023-10-05 MED ORDER — AMLODIPINE BESYLATE 5 MG PO TABS: 5.0000 mg | ORAL_TABLET | Freq: Every day | ORAL | 0 refills | Status: DC

## 2023-10-05 NOTE — Addendum Note (Signed)
 Addended by: Macie Burows on: 10/05/2023 07:49 AM   Modules accepted: Orders

## 2023-11-23 ENCOUNTER — Telehealth (HOSPITAL_BASED_OUTPATIENT_CLINIC_OR_DEPARTMENT_OTHER): Payer: Self-pay | Admitting: Licensed Clinical Social Worker

## 2023-11-23 ENCOUNTER — Ambulatory Visit: Payer: Self-pay | Attending: Internal Medicine | Admitting: Internal Medicine

## 2023-11-23 VITALS — BP 172/90 | HR 56 | Ht 63.0 in | Wt 163.2 lb

## 2023-11-23 DIAGNOSIS — I340 Nonrheumatic mitral (valve) insufficiency: Secondary | ICD-10-CM

## 2023-11-23 DIAGNOSIS — I2510 Atherosclerotic heart disease of native coronary artery without angina pectoris: Secondary | ICD-10-CM

## 2023-11-23 DIAGNOSIS — E785 Hyperlipidemia, unspecified: Secondary | ICD-10-CM

## 2023-11-23 DIAGNOSIS — I10 Essential (primary) hypertension: Secondary | ICD-10-CM

## 2023-11-23 DIAGNOSIS — Z951 Presence of aortocoronary bypass graft: Secondary | ICD-10-CM

## 2023-11-23 MED ORDER — AMLODIPINE BESYLATE 2.5 MG PO TABS: 2.5000 mg | ORAL_TABLET | Freq: Every day | ORAL | 3 refills | Status: AC

## 2023-11-23 NOTE — Patient Instructions (Signed)
 Medication Instructions:  Your physician has recommended you make the following change in your medication:  DECREASE: amlodipine  (Norvasc ) to 2.5 mg by mouth once daily  *If you need a refill on your cardiac medications before your next appointment, please call your pharmacy*  Lab Work: NONE  If you have labs (blood work) drawn today and your tests are completely normal, you will receive your results only by: MyChart Message (if you have MyChart) OR A paper copy in the mail If you have any lab test that is abnormal or we need to change your treatment, we will call you to review the results.  Testing/Procedures: NONE  Follow-Up: At [facility], you and your health needs are our priority.  As part of our continuing mission to provide you with exceptional heart care, our providers are all part of one team.  This team includes your primary Cardiologist (physician) and Advanced Practice Providers or APPs (Physician Assistants and Nurse Practitioners) who all work together to provide you with the care you need, when you need it.  Your next appointment:   12 month(s)  Provider:   Jann Melody, MD

## 2023-11-23 NOTE — Progress Notes (Signed)
 Cardiology Office Note:  .    Date:  11/23/2023  ID:  April Bryant, DOB 1961/06/27, MRN [id number] PCP: Ronna Coho, MD  [facility] Providers Cardiologist:  Jann Melody, MD     CC: White coat HTN follow up  History of Present Illness: .    April Bryant is a 63 y.o. female with hypertension and coronary artery disease who presents for follow-up after coronary artery bypass grafting. She is managed in partnership with Dr. Carmencita Chou, her rheumatologist.  She has a history of hypertension and hyperlipidemia, with consistently elevated blood pressures despite being on aspirin  81 mg, atorvastatin  80 mg, and lisinopril  40 mg. She experiences bradycardia and has been taking amlodipine  every other day due to low blood pressure readings at home. She notes fluid retention around her ankles and arms, which she associates with her medication regimen.  Her rheumatoid arthritis is managed by Dr. Carmencita Chou, and her condition is as well controlled as possible given her circumstances. Regular lab monitoring is conducted by her rheumatologist.  She recently experienced the significant life event of her wife's passing, which has impacted her activity levels and possibly her blood pressure control. Her LDL levels were slightly elevated due to decreased activity following her wife's death.  She is a former smoker and has since quit smoking. No current tobacco use.  Relevant histories: .  Social  2020-12-26: Multi-vessel CAD s/p CABG 12-26-21: Issues with HLD 12/27/2022: BP improved meds changed December 27, 2023: Wife passed away un clear causes ROS: As per HPI.   Studies Reviewed: .     Cardiac Studies & Procedures   ______________________________________________________________________________________________ CARDIAC CATHETERIZATION  CARDIAC CATHETERIZATION 10/13/2020  Conclusion  Culprit lesion is a large branching ramus intermedius and has ostial to proximal narrowing without landing zone  for PCI.  Left main is widely patent  LAD proximal to mid segment with intermediate stenosis in the 65 to 75% range documented to be hemodynamically significant by both RFR (0.82) and FFR (0.72).  Circumflex is relatively small.  There is a moderate-sized third obtuse marginal.  The mid vessel contains eccentric 50 to 60% narrowing.  RCA is dominant.  There is significant tortuosity.  The distal RCA before the PDA contains segmental 50% narrowing.  The ostial to proximal PDA is 60-70% narrowed.  Overall LV function is normal with EF 60%.  RECOMMENDATIONS:   TCTS consultation.  There is three-vessel coronary artery disease in this patient who smokes, has significant hypertension, is prediabetic, and hyperlipidemia.  PCI on ramus intermedius would require overhang of stent into the distal left main.  Findings Coronary Findings Diagnostic  Dominance: Right  Left Anterior Descending The vessel exhibits minimal luminal irregularities. Ost LAD to Prox LAD lesion is 75% stenosed.  Ramus Intermedius Vessel is large. Ramus lesion is 95% stenosed.  Left Circumflex Vessel is small. Prox Cx to Mid Cx lesion is 60% stenosed.  First Obtuse Marginal Branch Vessel is small in size.  Right Coronary Artery There is moderate diffuse disease throughout the vessel. Dist RCA lesion is 50% stenosed.  Right Posterior Descending Artery RPDA lesion is 75% stenosed.  Intervention  No interventions have been documented.     ECHOCARDIOGRAM  ECHOCARDIOGRAM COMPLETE 09/27/2023  Narrative ECHOCARDIOGRAM REPORT    Patient Name:   April Bryant Date of Exam: 09/27/2023 Medical Rec #:  [id number]        Height:       62.5 in Accession #:    [id number]  Weight:       161.2 lb Date of Birth:  [birth year]        BSA:          1.755 m Patient Age:    63 years         BP:           162/100 mmHg Patient Gender: F                HR:           58 bpm. Exam Location:  [address]  Procedure: 2D Echo, Cardiac Doppler, Color Doppler, 3D Echo and Strain Analysis (Both Spectral and Color Flow Doppler were utilized during procedure).  Indications:    I34.0 MR  History:        Patient has prior history of Echocardiogram examinations, most recent 10/13/2020. Previous Myocardial Infarction and CAD, Prior CABG, MR; Risk Factors:Hypertension, Dyslipidemia and Former Smoker.  Sonographer:    Lula Sale RDCS Referring Phys: [id number] [facility] A [name]  IMPRESSIONS   1. Left ventricular ejection fraction, by estimation, is 55 to 60%. Left ventricular ejection fraction by 3D volume is 57 %. The left ventricle has normal function. The left ventricle has no regional wall motion abnormalities. There is severe asymmetric left ventricular hypertrophy of the septal segment. Left ventricular diastolic parameters are indeterminate. Elevated left ventricular end-diastolic pressure. The average left ventricular global longitudinal strain is -15.4 %. The global longitudinal strain is abnormal. 2. Right ventricular systolic function is normal. The right ventricular size is normal. There is normal pulmonary artery systolic pressure. The estimated right ventricular systolic pressure is 31.9 mmHg. 3. The mitral valve is degenerative. Mild mitral valve regurgitation. No evidence of mitral stenosis. 4. The aortic valve is normal in structure. Aortic valve regurgitation is trivial. No aortic stenosis is present. Aortic regurgitation PHT measures 521 msec. 5. The inferior vena cava is normal in size with greater than 50% respiratory variability, suggesting right atrial pressure of 3 mmHg.  FINDINGS Left Ventricle: Left ventricular ejection fraction, by estimation, is 55 to 60%. Left ventricular ejection fraction by 3D volume is 57 %. The left ventricle has normal function. The left ventricle has no regional wall motion abnormalities. The average left ventricular global longitudinal  strain is -15.4 %. Strain was performed and the global longitudinal strain is abnormal. The left ventricular internal cavity size was normal in size. There is severe asymmetric left ventricular hypertrophy of the septal segment. Left ventricular diastolic parameters are indeterminate. Elevated left ventricular end-diastolic pressure.  Right Ventricle: The right ventricular size is normal. No increase in right ventricular wall thickness. Right ventricular systolic function is normal. There is normal pulmonary artery systolic pressure. The tricuspid regurgitant velocity is 2.69 m/s, and with an assumed right atrial pressure of 3 mmHg, the estimated right ventricular systolic pressure is 31.9 mmHg.  Left Atrium: Left atrial size was normal in size.  Right Atrium: Right atrial size was normal in size.  Pericardium: There is no evidence of pericardial effusion.  Mitral Valve: The mitral valve is degenerative in appearance. There is mild calcification of the mitral valve leaflet(s). Mild mitral valve regurgitation. No evidence of mitral valve stenosis.  Tricuspid Valve: The tricuspid valve is normal in structure. Tricuspid valve regurgitation is trivial. No evidence of tricuspid stenosis.  Aortic Valve: The aortic valve is normal in structure. Aortic valve regurgitation is trivial. Aortic regurgitation PHT measures 521 msec. No aortic stenosis is present.  Pulmonic Valve: The pulmonic valve  was normal in structure. Pulmonic valve regurgitation is trivial. No evidence of pulmonic stenosis.  Aorta: The aortic root is normal in size and structure.  Venous: The inferior vena cava is normal in size with greater than 50% respiratory variability, suggesting right atrial pressure of 3 mmHg.  IAS/Shunts: There is right bowing of the interatrial septum, suggestive of elevated left atrial pressure. No atrial level shunt detected by color flow Doppler.  Additional Comments: 3D was performed not requiring  image post processing on an independent workstation and was normal.   LEFT VENTRICLE PLAX 2D LVIDd:         4.20 cm         Diastology LVIDs:         2.80 cm         LV e' medial:    5.33 cm/s LV PW:         1.10 cm         LV E/e' medial:  20.8 LV IVS:        1.60 cm         LV e' lateral:   11.50 cm/s LVOT diam:     2.00 cm         LV E/e' lateral: 9.7 LV SV:         49 LV SV Index:   28              2D Longitudinal LVOT Area:     3.14 cm        Strain 2D Strain GLS   -16.7 % (A4C): 2D Strain GLS   -13.7 % (A3C): 2D Strain GLS   -15.8 % (A2C): 2D Strain GLS   -15.4 % Avg:  3D Volume EF LV 3D EF:    Left ventricul ar ejection fraction by 3D volume is 57 %.  3D Volume EF: 3D EF:        57 % LV EDV:       123 ml LV ESV:       52 ml LV SV:        71 ml  RIGHT VENTRICLE            IVC RV S prime:     8.05 cm/s  IVC diam: 1.30 cm TAPSE (M-mode): 1.3 cm RVSP:           31.9 mmHg  LEFT ATRIUM             Index        RIGHT ATRIUM           Index LA diam:        3.90 cm 2.22 cm/m   RA Pressure: 3.00 mmHg LA Vol (A2C):   51.3 ml 29.24 ml/m  RA Area:     16.10 cm LA Vol (A4C):   55.5 ml 31.63 ml/m  RA Volume:   43.50 ml  24.79 ml/m LA Biplane Vol: 53.0 ml 30.20 ml/m AORTIC VALVE LVOT Vmax:   79.80 cm/s LVOT Vmean:  49.200 cm/s LVOT VTI:    0.156 m AI PHT:      521 msec  AORTA Ao Root diam: 3.50 cm Ao Asc diam:  3.60 cm  MITRAL VALVE                TRICUSPID VALVE MV Area (PHT): 3.50 cm     TR Peak grad:   28.9 mmHg MV Decel Time: 217 msec     TR Vmax:  269.00 cm/s MV E velocity: 111.00 cm/s  Estimated RAP:  3.00 mmHg MV A velocity: 85.90 cm/s   RVSP:           31.9 mmHg MV E/A ratio:  1.29 SHUNTS Systemic VTI:  0.16 m Systemic Diam: 2.00 cm  Gaylyn Keas MD Electronically signed by Gaylyn Keas MD Signature Date/Time: 09/27/2023/1:44:08 PM    Final   TEE  ECHO INTRAOPERATIVE TEE 10/16/2020  Narrative *INTRAOPERATIVE TRANSESOPHAGEAL  REPORT *    Patient Name:   April Bryant Date of Exam: 10/16/2020 Medical Rec #:  [id number]        Height:       63.0 in Accession #:    [id number]       Weight:       142.8 lb Date of Birth:  Jan 07, 1961        BSA:          1.68 m Patient Age:    60 years         BP:           185/105 mmHg Patient Gender: F                HR:           76 bpm. Exam Location:  Anesthesiology  Transesophogeal exam was perform intraoperatively during surgical procedure. Patient was closely monitored under general anesthesia during the entirety of examination.  Indications:     Coronary Artery Disease Sonographer:     Ruta Cousins RDCS Performing Phys: Hobart Lulas MD Diagnosing Phys: Hobart Lulas MD  Complications: No known complications during this procedure. POST-OP IMPRESSIONS - Left Ventricle: The left ventricle is unchanged from pre-bypass. - Right Ventricle: The right ventricle appears unchanged from pre-bypass. - Aorta: The aorta appears unchanged from pre-bypass. - Left Atrium: The left atrium appears unchanged from pre-bypass. - Left Atrial Appendage: The left atrial appendage appears unchanged from pre-bypass. - Aortic Valve: The aortic valve appears unchanged from pre-bypass. - Mitral Valve: The mitral valve appears unchanged from pre-bypass. - Tricuspid Valve: The tricuspid valve appears unchanged from pre-bypass. - Interatrial Septum: The interatrial septum appears unchanged from pre-bypass. - Interventricular Septum: The interventricular septum appears unchanged from pre-bypass. - Pericardium: The pericardium appears unchanged from pre-bypass.  PRE-OP FINDINGS Left Ventricle: The left ventricle has normal systolic function, with an ejection fraction of 55-60%. The cavity size was normal. There is no increase in left ventricular wall thickness. There is no left ventricular hypertrophy.   Right Ventricle: The right ventricle has normal systolic function. The cavity was normal.  There is no increase in right ventricular wall thickness.  Left Atrium: Left atrial size was dilated. No left atrial/left atrial appendage thrombus was detected.  Right Atrium: Right atrial size was normal in size.  Interatrial Septum: No atrial level shunt detected by color flow Doppler.  Pericardium: There is no evidence of pericardial effusion.  Mitral Valve: The mitral valve is normal in structure. Mitral valve regurgitation is mild by color flow Doppler. There is No evidence of mitral stenosis.  Tricuspid Valve: The tricuspid valve was normal in structure. Tricuspid valve regurgitation was not visualized by color flow Doppler.  Aortic Valve: The aortic valve is tricuspid Aortic valve regurgitation was not visualized by color flow Doppler. There is no stenosis of the aortic valve.   Pulmonic Valve: The pulmonic valve was not assessed. Pulmonic valve regurgitation was not assessed by color flow Doppler.   Aorta: The aortic root, ascending aorta  and aortic arch are normal in size and structure.   Hobart Lulas MD Electronically signed by Hobart Lulas MD Signature Date/Time: 10/16/2020/12:04:47 PM    Final        ______________________________________________________________________________________________       Physical Exam:    VS:  BP (!) 172/90 (BP Location: Left Arm)   Pulse (!) 56   Ht 5\' 3"  (1.6 m)   Wt 163 lb 3.2 oz (74 kg)   SpO2 99%   BMI 28.91 kg/m    Wt Readings from Last 3 Encounters:  11/23/23 163 lb 3.2 oz (74 kg)  09/27/22 161 lb 3.2 oz (73.1 kg)  09/29/21 153 lb (69.4 kg)    Gen: no distress Cardiac: No Rubs or Gallops, no murmur, regular bradycardia +2 radial pulses Respiratory: Clear to auscultation bilaterally, normal effort, normal  respiratory rate GI: Soft, nontender, non-distended  MS: No  edema;  moves all extremities Integument: Skin feels warm Neuro:  At time of evaluation, alert and oriented to person/place/time/situation  Psych:  Normal affect, patient feels ok   ASSESSMENT AND PLAN: .    Coronary artery disease, status post CABG Status post coronary artery bypass grafting in 2022. Currently on aspirin  81 mg and atorvastatin  80 mg. LDL was elevated due to decreased activity around the time of spouse's death. Emphasized the importance of maintaining cholesterol levels below the upper limits of normal due to relatively young age at the time of coronary events and the presence of rheumatoid arthritis. - Continue aspirin  81 mg and atorvastatin  80 mg - Send a message to rheumatologist Dr. Marylin So to ensure fasting cholesterol is checked at next visit - Consider adding Zetia  if cholesterol levels are not well controlled  Hypertension Hypertension with elevated blood pressures. Blood pressure is very high today. History of white coat hypertension. Home blood pressures are generally well controlled, but there are instances of low blood pressure. Previously recommended amlodipine , which was started last year, but blood pressure remains high. Discussed reducing amlodipine  to address swelling and monitoring home blood pressures closely. - Monitor home blood pressures and report if she starts creeping up - If blood pressure increases after reducing amlodipine , temporarily increase back to 5 mg and consider alternative medications  Hyperlipidemia Hyperlipidemia with elevated LDL. Currently managed with atorvastatin  80 mg. Discussed the potential addition of Zetia  if cholesterol levels are not well controlled. - Continue atorvastatin  80 mg - Check fasting cholesterol at next visit with rheumatologist - Consider adding Zetia  if cholesterol levels are not well controlled  Edema Edema around ankles and arms, possibly related to amlodipine . Discussed reducing amlodipine  to see if swelling improves. Consideration of alternative medications if swelling persists. - Reduce amlodipine  to 2.5 mg if home blood pressures remain low to address  swelling - Monitor for improvement in swelling - If swelling persists, consider diuretic start  Bradycardia Bradycardia noted, but no symptoms- will not stop Coreg  at this time   Gloriann Larger, MD FASE [facility] Cardiologist [facility]  [address] [place], [place] [address] [phone]  12:00 PM

## 2023-11-23 NOTE — Telephone Encounter (Signed)
 H&V Care Navigation CSW Progress Note  Clinical Social Worker reached out to clinical staff to check in with pt interest in social work support as pt is self pay, and note recent loss of spouse. Per Terryann Fiddler, RN, pt declines at this time. Remain available as needed for assistance programs for medications/medical bills and/or other community resources.  Patient is participating in a Managed Medicaid Plan:  No, self pay only  SDOH Screenings   Tobacco Use: Medium Risk (09/27/2022)    Nathen Balder, MSW, LCSW Clinical Social Worker II [facility] Heart/Vascular Care Navigation  [phone]- work cell phone (preferred)

## 2023-11-24 ENCOUNTER — Telehealth: Payer: Self-pay | Admitting: Internal Medicine

## 2023-11-24 NOTE — Telephone Encounter (Signed)
 In ov note 11/23/23, Dr listed as Carmencita Chou and its Stefan Edge. Office requesting to update note and resend per [employer] [phone]

## 2024-02-20 ENCOUNTER — Other Ambulatory Visit: Payer: Self-pay | Admitting: Internal Medicine

## 2024-03-30 LAB — LAB REPORT - SCANNED
EGFR: 75
HM Hepatitis Screen: NEGATIVE

## 2024-04-02 ENCOUNTER — Ambulatory Visit: Payer: Self-pay

## 2024-05-20 ENCOUNTER — Other Ambulatory Visit: Payer: Self-pay | Admitting: Internal Medicine
